# Patient Record
Sex: Male | Born: 1980 | Race: Black or African American | Hispanic: No | Marital: Single | Smoking: Current every day smoker
Health system: Southern US, Community
[De-identification: ages and names within clinical notes are randomized; demographics above are authoritative.]

## PROBLEM LIST (undated history)

## (undated) DIAGNOSIS — J189 Pneumonia, unspecified organism: Secondary | ICD-10-CM

---

## 2014-10-27 ENCOUNTER — Emergency Department (HOSPITAL_COMMUNITY)
Admission: EM | Admit: 2014-10-27 | Discharge: 2014-10-27 | Disposition: A | Discharge status: Home / Self Care | Payer: Self-pay | Attending: Emergency Medicine | Admitting: Emergency Medicine

## 2014-10-27 ENCOUNTER — Encounter (HOSPITAL_COMMUNITY): Payer: Self-pay | Admitting: Emergency Medicine

## 2014-10-27 ENCOUNTER — Emergency Department (HOSPITAL_COMMUNITY): Payer: Self-pay

## 2014-10-27 DIAGNOSIS — R112 Nausea with vomiting, unspecified: Secondary | ICD-10-CM | POA: Insufficient documentation

## 2014-10-27 DIAGNOSIS — Z720 Tobacco use: Secondary | ICD-10-CM | POA: Insufficient documentation

## 2014-10-27 DIAGNOSIS — R630 Anorexia: Secondary | ICD-10-CM | POA: Insufficient documentation

## 2014-10-27 DIAGNOSIS — R61 Generalized hyperhidrosis: Secondary | ICD-10-CM | POA: Insufficient documentation

## 2014-10-27 DIAGNOSIS — R591 Generalized enlarged lymph nodes: Secondary | ICD-10-CM

## 2014-10-27 LAB — URINALYSIS, ROUTINE W REFLEX MICROSCOPIC
Bilirubin Urine: NEGATIVE
Glucose, UA: NEGATIVE mg/dL
HGB URINE DIPSTICK: NEGATIVE
Ketones, ur: 15 mg/dL — AB
Leukocytes, UA: NEGATIVE
NITRITE: NEGATIVE
Protein, ur: NEGATIVE mg/dL
SPECIFIC GRAVITY, URINE: 1.014 (ref 1.005–1.030)
Urobilinogen, UA: 0.2 mg/dL (ref 0.0–1.0)
pH: 6 (ref 5.0–8.0)

## 2014-10-27 LAB — CBC WITH DIFFERENTIAL/PLATELET
Basophils Absolute: 0.1 10*3/uL (ref 0.0–0.1)
Basophils Relative: 1 % (ref 0–1)
Eosinophils Absolute: 0.2 10*3/uL (ref 0.0–0.7)
Eosinophils Relative: 4 % (ref 0–5)
HCT: 46 % (ref 39.0–52.0)
HEMOGLOBIN: 15.5 g/dL (ref 13.0–17.0)
LYMPHS PCT: 29 % (ref 12–46)
Lymphs Abs: 1.8 10*3/uL (ref 0.7–4.0)
MCH: 28.2 pg (ref 26.0–34.0)
MCHC: 33.7 g/dL (ref 30.0–36.0)
MCV: 83.6 fL (ref 78.0–100.0)
MONOS PCT: 8 % (ref 3–12)
Monocytes Absolute: 0.5 10*3/uL (ref 0.1–1.0)
NEUTROS PCT: 58 % (ref 43–77)
Neutro Abs: 3.6 10*3/uL (ref 1.7–7.7)
Platelets: 263 10*3/uL (ref 150–400)
RBC: 5.5 MIL/uL (ref 4.22–5.81)
RDW: 13.5 % (ref 11.5–15.5)
WBC: 6.2 10*3/uL (ref 4.0–10.5)

## 2014-10-27 LAB — COMPREHENSIVE METABOLIC PANEL
ALBUMIN: 4.1 g/dL (ref 3.5–5.2)
ALK PHOS: 73 U/L (ref 39–117)
ALT: 15 U/L (ref 0–53)
AST: 22 U/L (ref 0–37)
Anion gap: 5 (ref 5–15)
BUN: 8 mg/dL (ref 6–23)
CHLORIDE: 102 mmol/L (ref 96–112)
CO2: 33 mmol/L — AB (ref 19–32)
CREATININE: 1.03 mg/dL (ref 0.50–1.35)
Calcium: 9.2 mg/dL (ref 8.4–10.5)
GFR calc Af Amer: >90 mL/min (ref >90)
GFR calc non Af Amer: >90 mL/min (ref >90)
GLUCOSE: 92 mg/dL (ref 70–99)
POTASSIUM: 3.4 mmol/L — AB (ref 3.5–5.1)
SODIUM: 140 mmol/L (ref 135–145)
Total Bilirubin: 0.2 mg/dL — ABNORMAL LOW (ref 0.3–1.2)
Total Protein: 7 g/dL (ref 6.0–8.3)

## 2014-10-27 LAB — LIPASE, BLOOD: LIPASE: 27 U/L (ref 11–59)

## 2014-10-27 LAB — MONONUCLEOSIS SCREEN: Mono Screen: NEGATIVE

## 2014-10-27 MED ORDER — ONDANSETRON HCL 4 MG/2ML IJ SOLN
4.0000 mg | Freq: Once | INTRAMUSCULAR | Status: AC
  Administered 2014-10-27: 4 mg via INTRAVENOUS
  Filled 2014-10-27: qty 2

## 2014-10-27 MED ORDER — ONDANSETRON HCL 4 MG PO TABS: 4.0000 mg | ORAL_TABLET | Freq: Four times a day (QID) | ORAL | Status: DC

## 2014-10-27 NOTE — ED Provider Notes (Signed)
CSN: [id number]     Arrival date & time 10/27/14  1141 History   First MD Initiated Contact with Patient 10/27/14 1156     Chief Complaint  Patient presents with  . Emesis     (Consider location/radiation/quality/duration/timing/severity/associated sxs/prior Treatment) HPI  This is a 34 year old male who presents emergency Department for multiple systemic complaints. The patient states that about 3 days ago he awoke from sleep with a soaking night sweats. Patient states that he had to change his clothing. He has had another episode last night. His partner is with him and she states that he felt extremely warm. During the day. He is afebrile, but complains of severe fatigue, loss of taste sensation, lack of appetite. The patient has 3 episodes of vomiting over the past 3 days. One per day. He states that he is not eating very much, so it is very little that comes up. He does continue to drink a lot of fluid and does complain of frequent urination. He denies burning with urination, pain with urination, dysuria, urgency, or hematuria. He denies abdominal pain, constipation, diarrhea. The patient endorses severe stress at home and has had what he thinks is about a 10 pound weight loss since October of last year. He endorses lack of appetite. He denies depression.  History reviewed. No pertinent past medical history. History reviewed. No pertinent past surgical history. No family history on file. History  Substance Use Topics  . Smoking status: Current Every Day Smoker  . Smokeless tobacco: Not on file  . Alcohol Use: Yes    Review of Systems Ten systems reviewed and are negative for acute change, except as noted in the HPI.    Allergies  Review of patient's allergies indicates no known allergies.  Home Medications   Prior to Admission medications   Medication Sig Start Date End Date Taking? Authorizing Provider  ondansetron (ZOFRAN) 4 MG tablet Take 1 tablet (4 mg total) by mouth every  6 (six) hours. 10/27/14   [place] Searles, PA-C   BP 105/62 mmHg  Pulse 69  Temp(Src) 98.1 F (36.7 C) (Oral)  Resp 16  Ht 5\' 10"  (1.778 m)  Wt 151 lb (68.493 kg)  BMI 21.67 kg/m2  SpO2 100% Physical Exam  Constitutional: He appears well-developed and well-nourished. No distress.  HENT:  Head: Normocephalic and atraumatic.  Eyes: Conjunctivae are normal. No scleral icterus.  Neck: Normal range of motion. Neck supple.  Cardiovascular: Normal rate, regular rhythm and normal heart sounds.   Pulmonary/Chest: Effort normal and breath sounds normal. No respiratory distress.  Abdominal: Soft. There is no tenderness.  Musculoskeletal: He exhibits no edema.  Lymphadenopathy:    He has no cervical adenopathy.    He has no axillary adenopathy.       Right: No supraclavicular and no epitrochlear adenopathy present.       Left: No supraclavicular and no epitrochlear adenopathy present.     Neurological: He is alert.  Skin: Skin is warm and dry. He is not diaphoretic.  Multiple, singular shotty subcutaneous nodules involving the anterior neck, forearms, and chest  Psychiatric: His behavior is normal.  Nursing note and vitals reviewed.   ED Course  Procedures (including critical care time) Labs Review Labs Reviewed  COMPREHENSIVE METABOLIC PANEL - Abnormal; Notable for the following:    Potassium 3.4 (*)    CO2 33 (*)    Total Bilirubin 0.2 (*)    All other components within normal limits  URINALYSIS, ROUTINE W REFLEX MICROSCOPIC -  Abnormal; Notable for the following:    Ketones, ur 15 (*)    All other components within normal limits  CBC WITH DIFFERENTIAL/PLATELET  LIPASE, BLOOD  MONONUCLEOSIS SCREEN  HIV ANTIBODY (ROUTINE TESTING)  RPR    Imaging Review No results found.   EKG Interpretation None      MDM   Final diagnoses:  Non-intractable vomiting with nausea, vomiting of unspecified type  Night sweats  Loss of appetite    Patient with nausea and vomiting.  Suspect acute viral process. Labs and imaging are unremarkable. I personally reviewed the imaging tests through PACS system. I have reviewed and interpreted Lab values. I reviewed available ER/hospitalization records through the EMR  Patient sxs improved with treatment. Patient seen in shared visit with attending physician. Patient appears safe for discharge at this time. Discussed return precautions.     Margarita Mail, PA-C 11/01/14 1025  Charlesetta Shanks, MD 11/01/14 1544

## 2014-10-27 NOTE — Discharge Instructions (Signed)
Nausea and Vomiting °Nausea is a sick feeling that often comes before throwing up (vomiting). Vomiting is a reflex where stomach contents come out of your mouth. Vomiting can cause severe loss of body fluids (dehydration). Children and elderly adults can become dehydrated quickly, especially if they also have diarrhea. Nausea and vomiting are symptoms of a condition or disease. It is important to find the cause of your symptoms. °CAUSES  °· Direct irritation of the stomach lining. This irritation can result from increased acid production (gastroesophageal reflux disease), infection, food poisoning, taking certain medicines (such as nonsteroidal anti-inflammatory drugs), alcohol use, or tobacco use. °· Signals from the brain. These signals could be caused by a headache, heat exposure, an inner ear disturbance, increased pressure in the brain from injury, infection, a tumor, or a concussion, pain, emotional stimulus, or metabolic problems. °· An obstruction in the gastrointestinal tract (bowel obstruction). °· Illnesses such as diabetes, hepatitis, gallbladder problems, appendicitis, kidney problems, cancer, sepsis, atypical symptoms of a heart attack, or eating disorders. °· Medical treatments such as chemotherapy and radiation. °· Receiving medicine that makes you sleep (general anesthetic) during surgery. °DIAGNOSIS °Your caregiver may ask for tests to be done if the problems do not improve after a few days. Tests may also be done if symptoms are severe or if the reason for the nausea and vomiting is not clear. Tests may include: °· Urine tests. °· Blood tests. °· Stool tests. °· Cultures (to look for evidence of infection). °· X-rays or other imaging studies. °Test results can help your caregiver make decisions about treatment or the need for additional tests. °TREATMENT °You need to stay well hydrated. Drink frequently but in small amounts. You may wish to drink water, sports drinks, clear broth, or eat frozen  ice pops or gelatin dessert to help stay hydrated. When you eat, eating slowly may help prevent nausea. There are also some antinausea medicines that may help prevent nausea. °HOME CARE INSTRUCTIONS  °· Take all medicine as directed by your caregiver. °· If you do not have an appetite, do not force yourself to eat. However, you must continue to drink fluids. °· If you have an appetite, eat a normal diet unless your caregiver tells you differently. °¨ Eat a variety of complex carbohydrates (rice, wheat, potatoes, bread), lean meats, yogurt, fruits, and vegetables. °¨ Avoid high-fat foods because they are more difficult to digest. °· Drink enough water and fluids to keep your urine clear or pale yellow. °· If you are dehydrated, ask your caregiver for specific rehydration instructions. Signs of dehydration may include: °¨ Severe thirst. °¨ Dry lips and mouth. °¨ Dizziness. °¨ Dark urine. °¨ Decreasing urine frequency and amount. °¨ Confusion. °¨ Rapid breathing or pulse. °SEEK IMMEDIATE MEDICAL CARE IF:  °· You have blood or brown flecks (like coffee grounds) in your vomit. °· You have black or bloody stools. °· You have a severe headache or stiff neck. °· You are confused. °· You have severe abdominal pain. °· You have chest pain or trouble breathing. °· You do not urinate at least once every 8 hours. °· You develop cold or clammy skin. °· You continue to vomit for longer than 24 to 48 hours. °· You have a fever. °MAKE SURE YOU:  °· Understand these instructions. °· Will watch your condition. °· Will get help right away if you are not doing well or get worse. °Document Released: 08/17/2005 Document Revised: 11/09/2011 Document Reviewed: 01/14/2011 °[employer]® Patient Information ©2015 [employer]. This information is not intended   to replace advice given to you by your health care provider. Make sure you discuss any questions you have with your health care provider. ° °Viral Infections °A viral infection can be  caused by different types of viruses. Most viral infections are not serious and resolve on their own. However, some infections may cause severe symptoms and may lead to further complications. °SYMPTOMS °Viruses can frequently cause: °· Minor sore throat. °· Aches and pains. °· Headaches. °· Runny nose. °· Different types of rashes. °· Watery eyes. °· Tiredness. °· Cough. °· Loss of appetite. °· Gastrointestinal infections, resulting in nausea, vomiting, and diarrhea. °These symptoms do not respond to antibiotics because the infection is not caused by bacteria. However, you might catch a bacterial infection following the viral infection. This is sometimes called a "superinfection." Symptoms of such a bacterial infection may include: °· Worsening sore throat with pus and difficulty swallowing. °· Swollen neck glands. °· Chills and a high or persistent fever. °· Severe headache. °· Tenderness over the sinuses. °· Persistent overall ill feeling (malaise), muscle aches, and tiredness (fatigue). °· Persistent cough. °· Yellow, green, or brown mucus production with coughing. °HOME CARE INSTRUCTIONS  °· Only take over-the-counter or prescription medicines for pain, discomfort, diarrhea, or fever as directed by your caregiver. °· Drink enough water and fluids to keep your urine clear or pale yellow. Sports drinks can provide valuable electrolytes, sugars, and hydration. °· Get plenty of rest and maintain proper nutrition. Soups and broths with crackers or rice are fine. °SEEK IMMEDIATE MEDICAL CARE IF:  °· You have severe headaches, shortness of breath, chest pain, neck pain, or an unusual rash. °· You have uncontrolled vomiting, diarrhea, or you are unable to keep down fluids. °· You or your child has an oral temperature above 102° F (38.9° C), not controlled by medicine. °· Your baby is older than 3 months with a rectal temperature of 102° F (38.9° C) or higher. °· Your baby is 3 months old or younger with a rectal  temperature of 100.4° F (38° C) or higher. °MAKE SURE YOU:  °· Understand these instructions. °· Will watch your condition. °· Will get help right away if you are not doing well or get worse. °Document Released: 05/27/2005 Document Revised: 11/09/2011 Document Reviewed: 12/22/2010 °Exit[employer]® Patient Information ©2015 [employer]. This information is not intended to replace advice given to you by your health care provider. Make sure you discuss any questions you have with your health care provider. ° °

## 2014-10-27 NOTE — ED Notes (Signed)
Pt. Stated, i started vomiting yesterday with chills and sweating.

## 2014-10-27 NOTE — ED Notes (Signed)
Pt in xray

## 2014-10-27 NOTE — ED Notes (Signed)
EDP at bedside  

## 2014-10-28 LAB — RPR: RPR Ser Ql: NONREACTIVE

## 2014-10-28 LAB — HIV ANTIBODY (ROUTINE TESTING W REFLEX): HIV Screen 4th Generation wRfx: NONREACTIVE

## 2015-03-22 ENCOUNTER — Emergency Department (HOSPITAL_COMMUNITY): Payer: Self-pay

## 2015-03-22 ENCOUNTER — Inpatient Hospital Stay (HOSPITAL_COMMUNITY)
Admission: EM | Admit: 2015-03-22 | Discharge: 2015-03-23 | DRG: 580 | Disposition: A | Discharge status: Home / Self Care | Payer: Self-pay | Attending: Surgery | Admitting: Surgery

## 2015-03-22 ENCOUNTER — Encounter (HOSPITAL_COMMUNITY): Payer: Self-pay | Admitting: Radiology

## 2015-03-22 DIAGNOSIS — W25XXXA Contact with sharp glass, initial encounter: Secondary | ICD-10-CM | POA: Diagnosis present

## 2015-03-22 DIAGNOSIS — S21112A Laceration without foreign body of left front wall of thorax without penetration into thoracic cavity, initial encounter: Secondary | ICD-10-CM | POA: Diagnosis present

## 2015-03-22 DIAGNOSIS — S1191XA Laceration without foreign body of unspecified part of neck, initial encounter: Secondary | ICD-10-CM | POA: Diagnosis present

## 2015-03-22 DIAGNOSIS — F17210 Nicotine dependence, cigarettes, uncomplicated: Secondary | ICD-10-CM | POA: Diagnosis present

## 2015-03-22 DIAGNOSIS — S1181XA Laceration without foreign body of other specified part of neck, initial encounter: Principal | ICD-10-CM | POA: Diagnosis present

## 2015-03-22 DIAGNOSIS — F10920 Alcohol use, unspecified with intoxication, uncomplicated: Secondary | ICD-10-CM

## 2015-03-22 LAB — COMPREHENSIVE METABOLIC PANEL
ALK PHOS: 64 U/L (ref 38–126)
ALT: 13 U/L — ABNORMAL LOW (ref 17–63)
AST: 21 U/L (ref 15–41)
Albumin: 4.4 g/dL (ref 3.5–5.0)
Anion gap: 14 (ref 5–15)
BUN: 7 mg/dL (ref 6–20)
CO2: 21 mmol/L — ABNORMAL LOW (ref 22–32)
Calcium: 8.7 mg/dL — ABNORMAL LOW (ref 8.9–10.3)
Chloride: 102 mmol/L (ref 101–111)
Creatinine, Ser: 1.03 mg/dL (ref 0.61–1.24)
GFR calc non Af Amer: >60 mL/min (ref >60)
Glucose, Bld: 95 mg/dL (ref 65–99)
POTASSIUM: 3.2 mmol/L — AB (ref 3.5–5.1)
Sodium: 137 mmol/L (ref 135–145)
Total Bilirubin: 0.6 mg/dL (ref 0.3–1.2)
Total Protein: 7.2 g/dL (ref 6.5–8.1)

## 2015-03-22 LAB — CBC
HEMATOCRIT: 43.4 % (ref 39.0–52.0)
Hemoglobin: 14.7 g/dL (ref 13.0–17.0)
MCH: 28.5 pg (ref 26.0–34.0)
MCHC: 33.9 g/dL (ref 30.0–36.0)
MCV: 84.1 fL (ref 78.0–100.0)
PLATELETS: 242 10*3/uL (ref 150–400)
RBC: 5.16 MIL/uL (ref 4.22–5.81)
RDW: 13.7 % (ref 11.5–15.5)
WBC: 7.7 10*3/uL (ref 4.0–10.5)

## 2015-03-22 LAB — PREPARE FRESH FROZEN PLASMA
Unit division: 0
Unit division: 0

## 2015-03-22 LAB — PROTIME-INR
INR: 1.03 (ref 0.00–1.49)
Prothrombin Time: 13.7 seconds (ref 11.6–15.2)

## 2015-03-22 LAB — ABO/RH: ABO/RH(D): AB POS

## 2015-03-22 LAB — ETHANOL: Alcohol, Ethyl (B): 139 mg/dL — ABNORMAL HIGH (ref <5)

## 2015-03-22 LAB — CDS SEROLOGY

## 2015-03-22 MED ORDER — SODIUM CHLORIDE 0.9 % IV BOLUS (SEPSIS): 1000.0000 mL | Freq: Once | INTRAVENOUS | Status: AC

## 2015-03-22 MED ORDER — SODIUM CHLORIDE 0.9 % IV SOLN
INTRAVENOUS | Status: AC | PRN
  Administered 2015-03-22: 1000 mL via INTRAVENOUS

## 2015-03-22 MED ORDER — IOHEXOL 350 MG/ML SOLN
50.0000 mL | Freq: Once | INTRAVENOUS | Status: AC | PRN
  Administered 2015-03-22: 50 mL via INTRAVENOUS
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
## 2015-03-22 NOTE — ED Notes (Signed)
To CT

## 2015-03-23 ENCOUNTER — Encounter (HOSPITAL_COMMUNITY): Payer: Self-pay | Admitting: *Deleted

## 2015-03-23 ENCOUNTER — Encounter (HOSPITAL_COMMUNITY): Admission: EM | Disposition: A | Discharge status: Home / Self Care | Payer: Self-pay | Source: Home / Self Care

## 2015-03-23 ENCOUNTER — Inpatient Hospital Stay (HOSPITAL_COMMUNITY): Payer: Self-pay | Admitting: Anesthesiology

## 2015-03-23 HISTORY — PX: DIRECT LARYNGOSCOPY: SHX5326

## 2015-03-23 HISTORY — PX: I & D EXTREMITY: SHX5045

## 2015-03-23 HISTORY — PX: ESOPHAGOSCOPY: SHX5534

## 2015-03-23 LAB — COMPREHENSIVE METABOLIC PANEL
ALBUMIN: 3.6 g/dL (ref 3.5–5.0)
ALT: 11 U/L — ABNORMAL LOW (ref 17–63)
AST: 19 U/L (ref 15–41)
Alkaline Phosphatase: 60 U/L (ref 38–126)
Anion gap: 6 (ref 5–15)
BUN: 5 mg/dL — AB (ref 6–20)
CALCIUM: 8.2 mg/dL — AB (ref 8.9–10.3)
CO2: 24 mmol/L (ref 22–32)
Chloride: 105 mmol/L (ref 101–111)
Creatinine, Ser: 0.8 mg/dL (ref 0.61–1.24)
GFR calc Af Amer: >60 mL/min (ref >60)
GFR calc non Af Amer: >60 mL/min (ref >60)
Glucose, Bld: 87 mg/dL (ref 65–99)
Potassium: 3.9 mmol/L (ref 3.5–5.1)
Sodium: 135 mmol/L (ref 135–145)
Total Bilirubin: 0.8 mg/dL (ref 0.3–1.2)
Total Protein: 6.1 g/dL — ABNORMAL LOW (ref 6.5–8.1)

## 2015-03-23 LAB — CBC
HCT: 41.1 % (ref 39.0–52.0)
HEMOGLOBIN: 13.7 g/dL (ref 13.0–17.0)
MCH: 28.7 pg (ref 26.0–34.0)
MCHC: 33.3 g/dL (ref 30.0–36.0)
MCV: 86.2 fL (ref 78.0–100.0)
PLATELETS: 206 10*3/uL (ref 150–400)
RBC: 4.77 MIL/uL (ref 4.22–5.81)
RDW: 14 % (ref 11.5–15.5)
WBC: 12.5 10*3/uL — AB (ref 4.0–10.5)

## 2015-03-23 LAB — MRSA PCR SCREENING: MRSA by PCR: NEGATIVE

## 2015-03-23 SURGERY — IRRIGATION AND DEBRIDEMENT EXTREMITY
Anesthesia: General

## 2015-03-23 MED ORDER — SUCCINYLCHOLINE CHLORIDE 20 MG/ML IJ SOLN
INTRAMUSCULAR | Status: DC | PRN
  Administered 2015-03-23: 160 mg via INTRAVENOUS

## 2015-03-23 MED ORDER — SODIUM CHLORIDE 0.9 % IV SOLN
INTRAVENOUS | Status: DC | PRN
  Administered 2015-03-23: 01:00:00 via INTRAVENOUS

## 2015-03-23 MED ORDER — BOOST / RESOURCE BREEZE PO LIQD: 1.0000 | Freq: Three times a day (TID) | ORAL | Status: DC

## 2015-03-23 MED ORDER — PANTOPRAZOLE SODIUM 40 MG PO TBEC
40.0000 mg | DELAYED_RELEASE_TABLET | Freq: Every day | ORAL | Status: DC
  Administered 2015-03-23: 40 mg via ORAL
  Filled 2015-03-23: qty 1

## 2015-03-23 MED ORDER — SODIUM CHLORIDE 0.9 % IV SOLN
INTRAVENOUS | Status: DC
  Administered 2015-03-23: 03:00:00 via INTRAVENOUS

## 2015-03-23 MED ORDER — HYDROCODONE-ACETAMINOPHEN 5-325 MG PO TABS: 1.0000 | ORAL_TABLET | Freq: Four times a day (QID) | ORAL | Status: DC | PRN

## 2015-03-23 MED ORDER — ROCURONIUM BROMIDE 50 MG/5ML IV SOLN
INTRAVENOUS | Status: AC
  Filled 2015-03-23: qty 1

## 2015-03-23 MED ORDER — ONDANSETRON HCL 4 MG PO TABS: 4.0000 mg | ORAL_TABLET | Freq: Four times a day (QID) | ORAL | Status: DC | PRN

## 2015-03-23 MED ORDER — PROPOFOL 10 MG/ML IV BOLUS
INTRAVENOUS | Status: DC | PRN
  Administered 2015-03-23: 160 mg via INTRAVENOUS

## 2015-03-23 MED ORDER — MIDAZOLAM HCL 2 MG/2ML IJ SOLN
INTRAMUSCULAR | Status: DC | PRN
  Administered 2015-03-23: 2 mg via INTRAVENOUS

## 2015-03-23 MED ORDER — CEFAZOLIN SODIUM-DEXTROSE 2-3 GM-% IV SOLR
INTRAVENOUS | Status: DC | PRN
  Administered 2015-03-23: 2 g via INTRAVENOUS

## 2015-03-23 MED ORDER — ONDANSETRON HCL 4 MG/2ML IJ SOLN
INTRAMUSCULAR | Status: AC
  Filled 2015-03-23: qty 2

## 2015-03-23 MED ORDER — HYDROMORPHONE HCL 1 MG/ML IJ SOLN
INTRAMUSCULAR | Status: AC
  Filled 2015-03-23: qty 1

## 2015-03-23 MED ORDER — MIDAZOLAM HCL 2 MG/2ML IJ SOLN
INTRAMUSCULAR | Status: AC
  Filled 2015-03-23: qty 2

## 2015-03-23 MED ORDER — HYDROMORPHONE HCL 1 MG/ML IJ SOLN
0.2500 mg | INTRAMUSCULAR | Status: DC | PRN
  Administered 2015-03-23 (×2): 0.5 mg via INTRAVENOUS

## 2015-03-23 MED ORDER — FENTANYL CITRATE (PF) 250 MCG/5ML IJ SOLN
INTRAMUSCULAR | Status: AC
  Filled 2015-03-23: qty 5

## 2015-03-23 MED ORDER — ONDANSETRON HCL 4 MG/2ML IJ SOLN
INTRAMUSCULAR | Status: DC | PRN
  Administered 2015-03-23: 4 mg via INTRAVENOUS

## 2015-03-23 MED ORDER — LIDOCAINE HCL (CARDIAC) 20 MG/ML IV SOLN
INTRAVENOUS | Status: DC | PRN
  Administered 2015-03-23: 100 mg via INTRAVENOUS

## 2015-03-23 MED ORDER — PNEUMOCOCCAL VAC POLYVALENT 25 MCG/0.5ML IJ INJ: 0.5000 mL | INJECTION | INTRAMUSCULAR | Status: DC

## 2015-03-23 MED ORDER — HYDROMORPHONE HCL 1 MG/ML IJ SOLN: 1.0000 mg | INTRAMUSCULAR | Status: DC | PRN

## 2015-03-23 MED ORDER — PANTOPRAZOLE SODIUM 40 MG IV SOLR
40.0000 mg | Freq: Every day | INTRAVENOUS | Status: DC
  Filled 2015-03-23: qty 40

## 2015-03-23 MED ORDER — FENTANYL CITRATE (PF) 250 MCG/5ML IJ SOLN
INTRAMUSCULAR | Status: DC | PRN
  Administered 2015-03-23: 100 ug via INTRAVENOUS

## 2015-03-23 MED ORDER — ONDANSETRON HCL 4 MG/2ML IJ SOLN: 4.0000 mg | Freq: Four times a day (QID) | INTRAMUSCULAR | Status: DC | PRN

## 2015-03-23 MED ORDER — PROMETHAZINE HCL 25 MG/ML IJ SOLN
6.2500 mg | INTRAMUSCULAR | Status: DC | PRN
Start: 2015-03-23 — End: 2015-03-23

## 2015-03-23 MED ORDER — 0.9 % SODIUM CHLORIDE (POUR BTL) OPTIME
TOPICAL | Status: DC | PRN
  Administered 2015-03-23: 1000 mL

## 2015-03-23 SURGICAL SUPPLY — 62 items
BALLN PULM 15 16.5 18 X 75CM (BALLOONS)
BALLN PULM 15 16.5 18X75 (BALLOONS)
BALLOON PULM 15 16.5 18X75 (BALLOONS) IMPLANT
BLADE SURG 15 STRL LF DISP TIS (BLADE) IMPLANT
BLADE SURG 15 STRL SS (BLADE)
BLADE SURG ROTATE 9660 (MISCELLANEOUS) IMPLANT
BNDG GAUZE ELAST 4 BULKY (GAUZE/BANDAGES/DRESSINGS) IMPLANT
CANISTER SUCTION 2500CC (MISCELLANEOUS) ×4 IMPLANT
CLEANER TIP ELECTROSURG 2X2 (MISCELLANEOUS) ×4 IMPLANT
COVER MAYO STAND STRL (DRAPES) ×4 IMPLANT
COVER SURGICAL LIGHT HANDLE (MISCELLANEOUS) ×4 IMPLANT
COVER TABLE BACK 60X90 (DRAPES) ×4 IMPLANT
DRAPE LAPAROTOMY TRNSV 102X78 (DRAPE) ×4 IMPLANT
DRAPE PROXIMA HALF (DRAPES) ×4 IMPLANT
DRAPE UTILITY XL STRL (DRAPES) ×8 IMPLANT
DRSG PAD ABDOMINAL 8X10 ST (GAUZE/BANDAGES/DRESSINGS) IMPLANT
ELECT REM PT RETURN 9FT ADLT (ELECTROSURGICAL) ×4
ELECTRODE REM PT RTRN 9FT ADLT (ELECTROSURGICAL) ×2 IMPLANT
GAUZE SPONGE 4X4 12PLY STRL (GAUZE/BANDAGES/DRESSINGS) ×4 IMPLANT
GAUZE SPONGE 4X4 16PLY XRAY LF (GAUZE/BANDAGES/DRESSINGS) ×4 IMPLANT
GLOVE BIO SURGEON STRL SZ7 (GLOVE) ×8 IMPLANT
GLOVE BIOGEL PI IND STRL 7.0 (GLOVE) ×8 IMPLANT
GLOVE BIOGEL PI IND STRL 7.5 (GLOVE) ×4 IMPLANT
GLOVE BIOGEL PI INDICATOR 7.0 (GLOVE) ×8
GLOVE BIOGEL PI INDICATOR 7.5 (GLOVE) ×4
GLOVE ECLIPSE 7.5 STRL STRAW (GLOVE) ×4 IMPLANT
GOWN STRL REUS W/ TWL LRG LVL3 (GOWN DISPOSABLE) ×6 IMPLANT
GOWN STRL REUS W/TWL LRG LVL3 (GOWN DISPOSABLE) ×6
GUARD TEETH (MISCELLANEOUS) IMPLANT
HANDPIECE INTERPULSE COAX TIP (DISPOSABLE)
KIT BASIN OR (CUSTOM PROCEDURE TRAY) IMPLANT
KIT ROOM TURNOVER OR (KITS) ×8 IMPLANT
MARKER SKIN DUAL TIP RULER LAB (MISCELLANEOUS) IMPLANT
NEEDLE 27GAX1X1/2 (NEEDLE) IMPLANT
NS IRRIG 1000ML POUR BTL (IV SOLUTION) ×8 IMPLANT
PACK SURGICAL SETUP 50X90 (CUSTOM PROCEDURE TRAY) ×4 IMPLANT
PAD ARMBOARD 7.5X6 YLW CONV (MISCELLANEOUS) ×16 IMPLANT
PATTIES SURGICAL .5 X3 (DISPOSABLE) IMPLANT
PENCIL BUTTON HOLSTER BLD 10FT (ELECTRODE) ×4 IMPLANT
SET HNDPC FAN SPRY TIP SCT (DISPOSABLE) IMPLANT
SOLUTION ANTI FOG 6CC (MISCELLANEOUS) IMPLANT
SPECIMEN JAR SMALL (MISCELLANEOUS) IMPLANT
SPONGE LAP 18X18 X RAY DECT (DISPOSABLE) ×4 IMPLANT
SUT ETHILON 3 0 PS 1 (SUTURE) ×8 IMPLANT
SUT MNCRL AB 4-0 PS2 18 (SUTURE) ×4 IMPLANT
SUT SILK 2 0 (SUTURE)
SUT SILK 2-0 18XBRD TIE 12 (SUTURE) IMPLANT
SUT VIC AB 2-0 SH 27 (SUTURE)
SUT VIC AB 2-0 SH 27X BRD (SUTURE) IMPLANT
SUT VIC AB 3-0 SH 18 (SUTURE) ×4 IMPLANT
SUT VIC AB 3-0 SH 27 (SUTURE)
SUT VIC AB 3-0 SH 27XBRD (SUTURE) IMPLANT
SYR CONTROL 10ML LL (SYRINGE) IMPLANT
SYR TB 1ML LUER SLIP (SYRINGE) IMPLANT
TAPE CLOTH SURG 4X10 WHT LF (GAUZE/BANDAGES/DRESSINGS) ×4 IMPLANT
TOWEL OR 17X24 6PK STRL BLUE (TOWEL DISPOSABLE) ×8 IMPLANT
TOWEL OR 17X26 10 PK STRL BLUE (TOWEL DISPOSABLE) ×8 IMPLANT
TUBE ANAEROBIC SPECIMEN COL (MISCELLANEOUS) IMPLANT
TUBE CONNECTING 12'X1/4 (SUCTIONS) ×2
TUBE CONNECTING 12X1/4 (SUCTIONS) ×6 IMPLANT
WATER STERILE IRR 1000ML POUR (IV SOLUTION) ×8 IMPLANT
YANKAUER SUCT BULB TIP NO VENT (SUCTIONS) ×4 IMPLANT
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
## 2015-03-23 NOTE — ED Notes (Signed)
Report given to Clarene Critchley, [place] in holding
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
## 2015-03-23 NOTE — Anesthesia Procedure Notes (Signed)
Procedure Name: Intubation Date/Time: 03/23/2015 12:41 AM Performed by: Valetta Fuller Pre-anesthesia Checklist: Patient identified, Emergency Drugs available, Suction available and Patient being monitored Patient Re-evaluated:Patient Re-evaluated prior to inductionOxygen Delivery Method: Circle system utilized Preoxygenation: Pre-oxygenation with 100% oxygen Intubation Type: IV induction, Rapid sequence and Cricoid Pressure applied Laryngoscope Size: Miller and 2 Grade View: Grade I Tube type: Oral Tube size: 7.5 mm Number of attempts: 1 Placement Confirmation: ETT inserted through vocal cords under direct vision,  positive ETCO2 and breath sounds checked- equal and bilateral Secured at: 24 cm Tube secured with: marked with tape. Dental Injury: Teeth and Oropharynx as per pre-operative assessment

## 2015-03-23 NOTE — Anesthesia Postprocedure Evaluation (Signed)
Anesthesia Post Note  Patient: Lance Walton  Procedure(s) Performed: Procedure(s) (LRB): IRRIGATION AND DEBRIDEMENT WOUND EXPLORATION OF NECK (Left) DIRECT LARYNGOSCOPY (N/A) ESOPHAGOSCOPY (N/A)  Anesthesia type: general  Patient location: PACU  Post pain: Pain level controlled  Post assessment: Patient's Cardiovascular Status Stable  Last Vitals:  Filed Vitals:   03/23/15 1000  BP: 113/68  Pulse: 70  Temp:   Resp: 18    Post vital signs: Reviewed and stable  Level of consciousness: sedated  Complications: No apparent anesthesia complications
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
## 2015-03-23 NOTE — ED Notes (Signed)
CSI at the bedside 

## 2015-03-23 NOTE — Anesthesia Preprocedure Evaluation (Addendum)
Anesthesia Evaluation  Patient identified by MRN, date of birth, ID band Patient awake    Reviewed: Allergy & Precautions, NPO status , Patient's Chart, lab work & pertinent test results  Airway Mallampati: I  TM Distance: >3 FB Neck ROM: Full    Dental  (+) Partial Upper, Dental Advisory Given,    Pulmonary COPDCurrent Smoker,    Pulmonary exam normal       Cardiovascular Rhythm:Regular Rate:Normal     Neuro/Psych    GI/Hepatic   Endo/Other    Renal/GU      Musculoskeletal   Abdominal   Peds  Hematology   Anesthesia Other Findings   Reproductive/Obstetrics                            Anesthesia Physical Anesthesia Plan  ASA: III and emergent  Anesthesia Plan: General   Post-op Pain Management:    Induction: Intravenous, Rapid sequence and Cricoid pressure planned  Airway Management Planned: Oral ETT  Additional Equipment:   Intra-op Plan:   Post-operative Plan: Extubation in OR  Informed Consent: I have reviewed the patients History and Physical, chart, labs and discussed the procedure including the risks, benefits and alternatives for the proposed anesthesia with the patient or authorized representative who has indicated his/her understanding and acceptance.   Dental advisory given  Plan Discussed with: CRNA, Anesthesiologist and Surgeon  Anesthesia Plan Comments:        Anesthesia Quick Evaluation

## 2015-03-23 NOTE — Transfer of Care (Signed)
Immediate Anesthesia Transfer of Care Note  Patient: Lance Walton  Procedure(s) Performed: Procedure(s): IRRIGATION AND DEBRIDEMENT WOUND EXPLORATION OF NECK (Left) DIRECT LARYNGOSCOPY (N/A) ESOPHAGOSCOPY (N/A)  Patient Location: PACU  Anesthesia Type:General  Level of Consciousness: sedated and patient cooperative  Airway & Oxygen Therapy: Patient connected to nasal cannula oxygen  Post-op Assessment: Report given to RN and Post -op Vital signs reviewed and stable  Post vital signs: Reviewed and stable  Last Vitals:  Filed Vitals:   03/23/15 0000  BP: 108/71  Pulse: 88  Temp:   Resp: 19    Complications: No apparent anesthesia complications
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
## 2015-03-23 NOTE — Discharge Instructions (Signed)
You may shower and gently cleanse the area.  May cover with a band aid.  Call if you have fevers over 101.

## 2015-03-25 ENCOUNTER — Telehealth (HOSPITAL_COMMUNITY): Payer: Self-pay

## 2015-03-25 ENCOUNTER — Emergency Department (HOSPITAL_COMMUNITY)
Admission: EM | Admit: 2015-03-25 | Discharge: 2015-03-25 | Discharge status: Against Medical Advice | Payer: Self-pay | Attending: Emergency Medicine | Admitting: Emergency Medicine

## 2015-03-25 ENCOUNTER — Encounter (HOSPITAL_COMMUNITY): Payer: Self-pay | Admitting: Surgery

## 2015-03-25 DIAGNOSIS — Z720 Tobacco use: Secondary | ICD-10-CM | POA: Insufficient documentation

## 2015-03-25 DIAGNOSIS — M542 Cervicalgia: Secondary | ICD-10-CM | POA: Insufficient documentation

## 2015-03-25 LAB — TYPE AND SCREEN
ABO/RH(D): AB POS
ANTIBODY SCREEN: NEGATIVE
UNIT DIVISION: 0
Unit division: 0

## 2015-03-25 MED ORDER — MORPHINE SULFATE 4 MG/ML IJ SOLN: 4.0000 mg | Freq: Once | INTRAMUSCULAR | Status: DC
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
## 2015-03-25 NOTE — ED Notes (Signed)
Went to patient's room and he is not there.   Will continue to monitor if patient returns.

## 2015-03-25 NOTE — ED Notes (Signed)
Patient not in room.   Patient may have eloped.   Cannot locate patient.

## 2015-03-25 NOTE — Telephone Encounter (Signed)
Pt called. Stated pain medication not working and would like something stronger. Advised to follow up with surgeon or if unable to reach he could return to Ed.
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
## 2015-03-29 ENCOUNTER — Encounter (HOSPITAL_COMMUNITY): Payer: Self-pay | Admitting: Emergency Medicine

## 2015-03-29 ENCOUNTER — Emergency Department (HOSPITAL_COMMUNITY)
Admission: EM | Admit: 2015-03-29 | Discharge: 2015-03-29 | Disposition: A | Discharge status: Home / Self Care | Payer: Self-pay | Attending: Emergency Medicine | Admitting: Emergency Medicine

## 2015-03-29 DIAGNOSIS — Z720 Tobacco use: Secondary | ICD-10-CM | POA: Insufficient documentation

## 2015-03-29 DIAGNOSIS — Z4802 Encounter for removal of sutures: Secondary | ICD-10-CM | POA: Insufficient documentation
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
## 2015-03-29 NOTE — ED Notes (Signed)
Dressing and bacitracin per PA student.

## 2015-03-29 NOTE — Discharge Instructions (Signed)
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
## 2016-08-31 ENCOUNTER — Emergency Department (HOSPITAL_COMMUNITY)
Admission: EM | Admit: 2016-08-31 | Discharge: 2016-08-31 | Disposition: A | Discharge status: Home / Self Care | Payer: Self-pay | Attending: Dermatology | Admitting: Dermatology

## 2016-08-31 ENCOUNTER — Encounter (HOSPITAL_COMMUNITY): Payer: Self-pay | Admitting: Emergency Medicine

## 2016-08-31 DIAGNOSIS — R197 Diarrhea, unspecified: Secondary | ICD-10-CM | POA: Insufficient documentation

## 2016-08-31 DIAGNOSIS — Z5321 Procedure and treatment not carried out due to patient leaving prior to being seen by health care provider: Secondary | ICD-10-CM | POA: Insufficient documentation

## 2016-08-31 DIAGNOSIS — R509 Fever, unspecified: Secondary | ICD-10-CM | POA: Insufficient documentation

## 2016-08-31 HISTORY — DX: Pneumonia, unspecified organism: J18.9

## 2016-08-31 LAB — COMPREHENSIVE METABOLIC PANEL
ALT: 13 U/L — ABNORMAL LOW (ref 17–63)
AST: 19 U/L (ref 15–41)
Albumin: 3.9 g/dL (ref 3.5–5.0)
Alkaline Phosphatase: 54 U/L (ref 38–126)
Anion gap: 12 (ref 5–15)
BILIRUBIN TOTAL: 0.6 mg/dL (ref 0.3–1.2)
BUN: 20 mg/dL (ref 6–20)
CO2: 26 mmol/L (ref 22–32)
Calcium: 8.8 mg/dL — ABNORMAL LOW (ref 8.9–10.3)
Chloride: 97 mmol/L — ABNORMAL LOW (ref 101–111)
Creatinine, Ser: 0.99 mg/dL (ref 0.61–1.24)
GFR calc Af Amer: >60 mL/min (ref >60)
Glucose, Bld: 119 mg/dL — ABNORMAL HIGH (ref 65–99)
Potassium: 3.4 mmol/L — ABNORMAL LOW (ref 3.5–5.1)
Sodium: 135 mmol/L (ref 135–145)
Total Protein: 7.4 g/dL (ref 6.5–8.1)

## 2016-08-31 LAB — CBC
HEMATOCRIT: 41.4 % (ref 39.0–52.0)
Hemoglobin: 13.8 g/dL (ref 13.0–17.0)
MCH: 27.4 pg (ref 26.0–34.0)
MCHC: 33.3 g/dL (ref 30.0–36.0)
MCV: 82.1 fL (ref 78.0–100.0)
Platelets: 196 10*3/uL (ref 150–400)
RBC: 5.04 MIL/uL (ref 4.22–5.81)
RDW: 13 % (ref 11.5–15.5)
WBC: 10.3 10*3/uL (ref 4.0–10.5)

## 2016-08-31 LAB — URINALYSIS, ROUTINE W REFLEX MICROSCOPIC
Bilirubin Urine: NEGATIVE
Glucose, UA: NEGATIVE mg/dL
Ketones, ur: 20 mg/dL — AB
LEUKOCYTES UA: NEGATIVE
Nitrite: NEGATIVE
Protein, ur: 100 mg/dL — AB
SPECIFIC GRAVITY, URINE: 1.029 (ref 1.005–1.030)
WBC, UA: NONE SEEN WBC/hpf (ref 0–5)
pH: 5 (ref 5.0–8.0)

## 2016-08-31 LAB — LIPASE, BLOOD: Lipase: 43 U/L (ref 11–51)

## 2016-08-31 NOTE — ED Notes (Signed)
Name called to lobby x1 with no response.
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
## 2016-08-31 NOTE — ED Notes (Signed)
Name called to lobby with no response.

## 2016-08-31 NOTE — ED Notes (Addendum)
Patient left facility after being triaged but before being seen by a provider. Patient was conscious, alert, oriented, ambulatory, breathing normally, and appeared to be in no acute distress.

## 2016-08-31 NOTE — ED Notes (Signed)
Called back for vital signs, patient not seen in lobby

## 2021-08-04 ENCOUNTER — Encounter (HOSPITAL_COMMUNITY): Payer: Self-pay

## 2021-08-04 ENCOUNTER — Ambulatory Visit (INDEPENDENT_AMBULATORY_CARE_PROVIDER_SITE_OTHER): Payer: Self-pay

## 2021-08-04 ENCOUNTER — Ambulatory Visit (HOSPITAL_COMMUNITY)
Admission: EM | Admit: 2021-08-04 | Discharge: 2021-08-04 | Disposition: A | Discharge status: Home / Self Care | Payer: Self-pay | Attending: Family Medicine | Admitting: Family Medicine

## 2021-08-04 ENCOUNTER — Other Ambulatory Visit: Payer: Self-pay

## 2021-08-04 DIAGNOSIS — S62313A Displaced fracture of base of third metacarpal bone, left hand, initial encounter for closed fracture: Secondary | ICD-10-CM

## 2021-08-04 MED ORDER — HYDROCODONE-ACETAMINOPHEN 5-325 MG PO TABS: 1.0000 | ORAL_TABLET | Freq: Four times a day (QID) | ORAL | 0 refills | Status: DC | PRN

## 2021-08-04 MED ORDER — IBUPROFEN 800 MG PO TABS: 800.0000 mg | ORAL_TABLET | Freq: Three times a day (TID) | ORAL | 0 refills | Status: DC
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
## 2021-08-04 NOTE — Discharge Instructions (Addendum)
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
## 2021-10-27 ENCOUNTER — Other Ambulatory Visit: Payer: Self-pay

## 2021-10-27 ENCOUNTER — Encounter (HOSPITAL_COMMUNITY): Payer: Self-pay

## 2021-10-27 ENCOUNTER — Ambulatory Visit (HOSPITAL_COMMUNITY)
Admission: EM | Admit: 2021-10-27 | Discharge: 2021-10-27 | Disposition: A | Discharge status: Home / Self Care | Payer: Self-pay | Attending: Family Medicine | Admitting: Family Medicine

## 2021-10-27 DIAGNOSIS — D179 Benign lipomatous neoplasm, unspecified: Secondary | ICD-10-CM

## 2021-10-27 NOTE — Discharge Instructions (Addendum)
You were seen today for clearance to donate plasma.  There is no concern about these cysts and they are not a limitation to plasma donation at this time.

## 2021-10-27 NOTE — ED Triage Notes (Signed)
Pt reports having multiple abscesses to throat, he states he has had them since he was a child.
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
## 2021-11-13 ENCOUNTER — Other Ambulatory Visit (HOSPITAL_COMMUNITY)
Admission: EM | Admit: 2021-11-13 | Discharge: 2021-11-15 | Disposition: A | Discharge status: Home / Self Care | Payer: No Payment, Other | Attending: Family | Admitting: Family

## 2021-11-13 DIAGNOSIS — F1420 Cocaine dependence, uncomplicated: Secondary | ICD-10-CM | POA: Diagnosis present

## 2021-11-13 DIAGNOSIS — Z789 Other specified health status: Secondary | ICD-10-CM

## 2021-11-13 DIAGNOSIS — F1020 Alcohol dependence, uncomplicated: Secondary | ICD-10-CM | POA: Diagnosis present

## 2021-11-13 DIAGNOSIS — F1410 Cocaine abuse, uncomplicated: Secondary | ICD-10-CM | POA: Diagnosis not present

## 2021-11-13 DIAGNOSIS — Z20822 Contact with and (suspected) exposure to covid-19: Secondary | ICD-10-CM | POA: Diagnosis not present

## 2021-11-13 DIAGNOSIS — F1090 Alcohol use, unspecified, uncomplicated: Secondary | ICD-10-CM | POA: Diagnosis not present

## 2021-11-13 HISTORY — DX: Cocaine dependence, uncomplicated: F14.20

## 2021-11-13 LAB — LIPID PANEL
Cholesterol: 133 mg/dL (ref 0–200)
HDL: 62 mg/dL (ref >40)
LDL Cholesterol: 48 mg/dL (ref 0–99)
Total CHOL/HDL Ratio: 2.1 RATIO
Triglycerides: 114 mg/dL (ref <150)
VLDL: 23 mg/dL (ref 0–40)

## 2021-11-13 LAB — POCT URINE DRUG SCREEN - MANUAL ENTRY (I-SCREEN)
POC Amphetamine UR: NOT DETECTED
POC Buprenorphine (BUP): NOT DETECTED
POC Cocaine UR: POSITIVE — AB
POC Marijuana UR: POSITIVE — AB
POC Methadone UR: NOT DETECTED
POC Methamphetamine UR: NOT DETECTED
POC Morphine: NOT DETECTED
POC Oxazepam (BZO): NOT DETECTED
POC Oxycodone UR: NOT DETECTED
POC Secobarbital (BAR): NOT DETECTED

## 2021-11-13 LAB — COMPREHENSIVE METABOLIC PANEL
ALT: 16 U/L (ref 0–44)
AST: 22 U/L (ref 15–41)
Albumin: 3.9 g/dL (ref 3.5–5.0)
Alkaline Phosphatase: 52 U/L (ref 38–126)
Anion gap: 12 (ref 5–15)
BUN: 7 mg/dL (ref 6–20)
CO2: 25 mmol/L (ref 22–32)
Calcium: 9 mg/dL (ref 8.9–10.3)
Chloride: 99 mmol/L (ref 98–111)
Creatinine, Ser: 0.77 mg/dL (ref 0.61–1.24)
GFR, Estimated: >60 mL/min (ref >60)
Glucose, Bld: 138 mg/dL — ABNORMAL HIGH (ref 70–99)
Potassium: 3.9 mmol/L (ref 3.5–5.1)
Sodium: 136 mmol/L (ref 135–145)
Total Bilirubin: 0.5 mg/dL (ref 0.3–1.2)
Total Protein: 5.9 g/dL — ABNORMAL LOW (ref 6.5–8.1)

## 2021-11-13 LAB — CBC WITH DIFFERENTIAL/PLATELET
Abs Immature Granulocytes: 0.04 10*3/uL (ref 0.00–0.07)
Basophils Absolute: 0 10*3/uL (ref 0.0–0.1)
Basophils Relative: 0 %
Eosinophils Absolute: 0 10*3/uL (ref 0.0–0.5)
Eosinophils Relative: 0 %
HCT: 47.9 % (ref 39.0–52.0)
Hemoglobin: 16.3 g/dL (ref 13.0–17.0)
Immature Granulocytes: 0 %
Lymphocytes Relative: 13 %
Lymphs Abs: 1.7 10*3/uL (ref 0.7–4.0)
MCH: 28.8 pg (ref 26.0–34.0)
MCHC: 34 g/dL (ref 30.0–36.0)
MCV: 84.6 fL (ref 80.0–100.0)
Monocytes Absolute: 0.5 10*3/uL (ref 0.1–1.0)
Monocytes Relative: 4 %
Neutro Abs: 11.2 10*3/uL — ABNORMAL HIGH (ref 1.7–7.7)
Neutrophils Relative %: 83 %
Platelets: 267 10*3/uL (ref 150–400)
RBC: 5.66 MIL/uL (ref 4.22–5.81)
RDW: 14.2 % (ref 11.5–15.5)
WBC: 13.5 10*3/uL — ABNORMAL HIGH (ref 4.0–10.5)
nRBC: 0 % (ref 0.0–0.2)

## 2021-11-13 LAB — MAGNESIUM: Magnesium: 2.1 mg/dL (ref 1.7–2.4)

## 2021-11-13 LAB — RESP PANEL BY RT-PCR (FLU A&B, COVID) ARPGX2
Influenza A by PCR: NEGATIVE
Influenza B by PCR: NEGATIVE
SARS Coronavirus 2 by RT PCR: NEGATIVE

## 2021-11-13 LAB — ETHANOL: Alcohol, Ethyl (B): 13 mg/dL — ABNORMAL HIGH (ref <10)

## 2021-11-13 LAB — POC SARS CORONAVIRUS 2 AG: SARSCOV2ONAVIRUS 2 AG: NEGATIVE

## 2021-11-13 LAB — TSH: TSH: 0.239 u[IU]/mL — ABNORMAL LOW (ref 0.350–4.500)

## 2021-11-13 LAB — HEMOGLOBIN A1C
Hgb A1c MFr Bld: 5.3 % (ref 4.8–5.6)
Mean Plasma Glucose: 105.41 mg/dL

## 2021-11-13 LAB — POC SARS CORONAVIRUS 2 AG -  ED: SARS Coronavirus 2 Ag: NEGATIVE

## 2021-11-13 MED ORDER — LORAZEPAM 1 MG PO TABS
1.0000 mg | ORAL_TABLET | Freq: Four times a day (QID) | ORAL | Status: DC
  Administered 2021-11-13 – 2021-11-14 (×4): 1 mg via ORAL
  Filled 2021-11-13 (×6): qty 1

## 2021-11-13 MED ORDER — LORAZEPAM 1 MG PO TABS: 1.0000 mg | ORAL_TABLET | Freq: Two times a day (BID) | ORAL | Status: DC

## 2021-11-13 MED ORDER — HYDROXYZINE HCL 25 MG PO TABS: 25.0000 mg | ORAL_TABLET | Freq: Three times a day (TID) | ORAL | Status: DC | PRN

## 2021-11-13 MED ORDER — ONDANSETRON 4 MG PO TBDP: 4.0000 mg | ORAL_TABLET | Freq: Four times a day (QID) | ORAL | Status: DC | PRN

## 2021-11-13 MED ORDER — ALUM & MAG HYDROXIDE-SIMETH 200-200-20 MG/5ML PO SUSP: 30.0000 mL | ORAL | Status: DC | PRN

## 2021-11-13 MED ORDER — MAGNESIUM HYDROXIDE 400 MG/5ML PO SUSP: 30.0000 mL | Freq: Every day | ORAL | Status: DC | PRN

## 2021-11-13 MED ORDER — ACETAMINOPHEN 325 MG PO TABS: 650.0000 mg | ORAL_TABLET | Freq: Four times a day (QID) | ORAL | Status: DC | PRN

## 2021-11-13 MED ORDER — LORAZEPAM 1 MG PO TABS
1.0000 mg | ORAL_TABLET | Freq: Four times a day (QID) | ORAL | Status: DC | PRN
Start: 2021-11-13 — End: 2021-11-15

## 2021-11-13 MED ORDER — ADULT MULTIVITAMIN W/MINERALS CH
1.0000 | ORAL_TABLET | Freq: Every day | ORAL | Status: DC
  Administered 2021-11-13: 1 via ORAL
  Filled 2021-11-13 (×4): qty 1

## 2021-11-13 MED ORDER — LOPERAMIDE HCL 2 MG PO CAPS: 2.0000 mg | ORAL_CAPSULE | ORAL | Status: DC | PRN

## 2021-11-13 MED ORDER — TRAZODONE HCL 50 MG PO TABS
50.0000 mg | ORAL_TABLET | Freq: Every evening | ORAL | Status: DC | PRN
  Filled 2021-11-13: qty 1

## 2021-11-13 MED ORDER — LORAZEPAM 1 MG PO TABS
1.0000 mg | ORAL_TABLET | Freq: Every day | ORAL | Status: DC
Start: 2021-11-18 — End: 2021-11-15

## 2021-11-13 MED ORDER — THIAMINE HCL 100 MG/ML IJ SOLN
100.0000 mg | Freq: Once | INTRAMUSCULAR | Status: AC
  Administered 2021-11-13: 100 mg via INTRAMUSCULAR
  Filled 2021-11-13: qty 2

## 2021-11-13 MED ORDER — LORAZEPAM 1 MG PO TABS: 1.0000 mg | ORAL_TABLET | Freq: Three times a day (TID) | ORAL | Status: DC

## 2021-11-13 MED ORDER — THIAMINE HCL 100 MG PO TABS
100.0000 mg | ORAL_TABLET | Freq: Every day | ORAL | Status: DC
  Filled 2021-11-13 (×3): qty 1
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
## 2021-11-13 NOTE — ED Notes (Signed)
Pt pending 2 Hr COVID and transfer to [facility].  Comfort measures given. ?

## 2021-11-13 NOTE — BH Assessment (Signed)
Patient states, "I am not going to lie to you, I need help with my alcohol and cocaine problem."  Patient states that this is the first time that he has ever sought help for himself. Patient states that he he is sick of living this way.  He states that he has a 39 year old daughter he has custody of that he needs to be a better father to, he has a child getting ready to graduate high school and he states that it is time for him to step up and be the father he needs to be. Patient drinks five 24 oz beers daily, he states that he last used today.  He states that he has been using cocaine and marijuana daily.  Patient denies SI/HI/Psychosis.  Patient is urgent due to his need for detox. ?

## 2021-11-13 NOTE — ED Notes (Signed)
Pt asleep in bed. Respirations even and unlabored. Will continue to monitor for safety. ?
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
## 2021-11-14 ENCOUNTER — Encounter (HOSPITAL_COMMUNITY): Payer: Self-pay | Admitting: Family

## 2021-11-14 DIAGNOSIS — Z20822 Contact with and (suspected) exposure to covid-19: Secondary | ICD-10-CM | POA: Diagnosis not present

## 2021-11-14 DIAGNOSIS — F1090 Alcohol use, unspecified, uncomplicated: Secondary | ICD-10-CM | POA: Diagnosis not present

## 2021-11-14 DIAGNOSIS — F1410 Cocaine abuse, uncomplicated: Secondary | ICD-10-CM | POA: Diagnosis not present

## 2021-11-14 MED ORDER — NICOTINE 21 MG/24HR TD PT24
21.0000 mg | MEDICATED_PATCH | Freq: Every day | TRANSDERMAL | Status: DC
  Administered 2021-11-14: 21 mg via TRANSDERMAL
  Filled 2021-11-14 (×2): qty 1

## 2021-11-14 NOTE — Progress Notes (Signed)
Pt refused all morning meds. Pt stated that he does not want to take meds on an empty stomach. Options for breakfast was offered but he refused. Staff will monitor for pt's safety. ?

## 2021-11-14 NOTE — Progress Notes (Signed)
Pt is awake, alert and oriented. Pt did not voice any complaints of pain or discomfort. No signs of acute distress noted. Administered scheduled med with no issue. Pt denies current SI/HI/AVH. Staff will monitor for pt's safety. ?

## 2021-11-14 NOTE — ED Notes (Signed)
Pt resting quietly.  Care assumed.  ?

## 2021-11-14 NOTE — Clinical Social Work Psych Note (Signed)
LCSW Update Note ? ? ?Lance Walton was scheduled for a Screening for Admission appointment at [facility] on Tuesday, 11/18/21 at 9:00am for residential substance abuse treatment services.  ? ?Lance Walton will require a 14-day supply of medications, in addition to his 30 day prescription.  ? ? ?Lance Walton will be transported to [facility] via taxi voucher.  ? ? ? ?LCSW will continue to follow until discharge.  ? ? ?Radonna Ricker, MSW, LCSW ?Clinical Education officer, museum Insurance claims handler) ?[facility]  ? ? ? ?

## 2021-11-14 NOTE — ED Notes (Signed)
Lunch given.
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
## 2021-11-14 NOTE — Progress Notes (Signed)
Pt is asleep. Respirations are even and unlabored. No distress noted. Staff will monitor for pt's safety. 

## 2021-11-14 NOTE — Clinical Social Work Psych Note (Signed)
LCSW Initial Note ? ? ?LCSW met with Lance Walton for introduction and to begin discussions regarding treatment and potential discharge planning.  ? ? ?Lance Walton presented with a flat affect, euthymic mood. He denied having any SI, HI or AVH at this time. Lance Walton shared that he presented to the [facility] seeking assistance for detox services.  ? ?Lance Walton endorsed drinking alcohol, using cocaine and smoking cannabis on a daily basis. Lance Walton reports drinking on average, 3 (24 oz) beers daily, $80 worth of cocaine daily, and a varied amount of cannabis on a daily basis. Lance Walton shared that he has used each substance for 20+ years and this is his first attempt seeking professional assistance/services.   ? ?Lance Walton did not identify any triggering event or circumstances that contributes to his ongoing substance use, however he expressed "I am tired of living this way" as a motivating factor.  ? ?Lance Walton shared that he currently lives with his girlfriend and his 6yo daughter from another relationship. Lance Walton shared that he has had custody of his 6yo daughter since she was 1yo, due to her mother "abandoning her". He reports he also has an 39yo who is preparing to graduate from [employer], and that she lives with her mother. Lance Walton identifies his children as his main source of motivation.  ? ?Lance Walton expressed interest in residential treatment services for his ongoing substance abuse issues. LCSW explained the referral process  and Lance Walton was agreeable.  ? ?LCSW referred Lance Walton to [facility] for possible admission. ? ?Lance Walton denied having any additional questions or concerns at this time.  ? ?LCSW will continue to follow.  ? ?Radonna Ricker, MSW, LCSW ?Clinical Education officer, museum Insurance claims handler) ?[facility]  ?  ? ?

## 2021-11-14 NOTE — ED Notes (Signed)
Pt asleep in bed. Respirations even and unlabored. Will continue to monitor for safety. ?

## 2021-11-14 NOTE — ED Notes (Signed)
Pt has been resting quietly through the evening.  He does awaken to voice.  Denies si, hi and AVH.  Q 15 min checks in place.  Staff will continue to monitor for safety.  ?

## 2021-11-14 NOTE — ED Notes (Signed)
Pt refuse breakfast and all option that were given.  ?

## 2021-11-14 NOTE — ED Provider Notes (Signed)
Behavioral Health Progress Note  Date and Time: 11/14/2021 2:20 PM Name: Lance Walton MRN:  [id number]  Subjective:   41 yo male with history of substance abuse who presented to the [facility] on 11/13/2021 with request for assistance with detox from alcohol and daily drinking-patient was admitted tot [facility] for detox and crisis stablization. UDS+cocaine and marijuana. Etoh 13. Ativan detox protocol initiated on admission.   Patient seen and chart reviewed-he declined morning doses of medications including ativan. Most recent CIWA 0. Patient describes his mood as "alright". He denies SI/HI/AVH. He denies alcohol withdrawal sx of nausea, vomiting, headache, GI upset, diarrhea, tremors, diaphoresis. He denies previous medication trials, previous psychiatric treatment or hospitalizations, outpatient psychiatrist. He reports drug use as per HPI and also requests nicotine patch. Patient reports that he refused medications because he did not wish to take medications on an empty stomach and states that he does not like oatmeal so he declined breakfast. Patient denies all other physical complaints.  Discussed with patient that he was started on ativan taper for alcohol detox which is scheduled to end Tuesday and that [facility] will [facility]-patient is informed that he will be notified with updates. Patient was given the opportunity to ask questions and  All questions answered. Patient verbalized understanding regarding plan of care.       Diagnosis:  Final diagnoses:  Alcohol use  Cocaine use disorder (HCC)    Total Time spent with patient: 20 minutes  Past Psychiatric History: substance use Past Medical History:  Past Medical History:  Diagnosis Date   Pneumonia     Past Surgical History:  Procedure Laterality Date   DIRECT LARYNGOSCOPY N/A 03/23/2015   Procedure: DIRECT LARYNGOSCOPY;  Surgeon: Serena Colonel, MD;  Location: [facility] OR;  Service: ENT;  Laterality: N/A;    ESOPHAGOSCOPY N/A 03/23/2015   Procedure: ESOPHAGOSCOPY;  Surgeon: Serena Colonel, MD;  Location: [facility] OR;  Service: ENT;  Laterality: N/A;   I & D EXTREMITY Left 03/23/2015   Procedure: IRRIGATION AND DEBRIDEMENT WOUND EXPLORATION OF NECK;  Surgeon: Manus Rudd, MD;  Location: [facility] OR;  Service: General;  Laterality: Left;   Family History:  Family History  Family history unknown: Yes   Family Psychiatric  History: mother and father with addiction Social History:  Social History   Substance and Sexual Activity  Alcohol Use Yes     Social History   Substance and Sexual Activity  Drug Use No    Social History   Socioeconomic History   Marital status: Single    Spouse name: Not on file   Number of children: Not on file   Years of education: Not on file   Highest education level: Not on file  Occupational History   Not on file  Tobacco Use   Smoking status: Every Day   Smokeless tobacco: Never  Substance and Sexual Activity   Alcohol use: Yes   Drug use: No   Sexual activity: Not on file  Other Topics Concern   Not on file  Social History Narrative   Not on file   Social Determinants of Health   Financial Resource Strain: Not on file  Food Insecurity: Not on file  Transportation Needs: Not on file  Physical Activity: Not on file  Stress: Not on file  Social Connections: Not on file   SDOH:  SDOH Screenings   Alcohol Screen: Not on file  Depression (PHQ2-9): Medium Risk   PHQ-2 Score: 9  Financial Resource Strain: Not on file  Food Insecurity: Not on file  Housing: Not on file  Physical Activity: Not on file  Social Connections: Not on file  Stress: Not on file  Tobacco Use: High Risk   Smoking Tobacco Use: Every Day   Smokeless Tobacco Use: Never   Passive Exposure: Not on file  Transportation Needs: Not on file   Additional Social History:    Pain Medications: See MAR Prescriptions: See MAR Over the Counter: See MAR History of alcohol / drug use?:  Yes Longest period of sobriety (when/how long): Unknown Negative Consequences of Use:  (None noted) Withdrawal Symptoms: Nausea / Vomiting, Fever / Chills, Patient aware of relationship between substance abuse and physical/medical complications Name of Substance 1: EtOH 1 - Age of First Use: Unknown 1 - Amount (size/oz): 2-3 40-ounce beers 1 - Frequency: Daily 1 - Duration: Ongoing 1 - Last Use / Amount: Today (11/13/21) 1 - Method of Aquiring: Purchase 1- Route of Use: Oral Name of Substance 2: Cocaine 2 - Age of First Use: Unknown 2 - Amount (size/oz): $40 2 - Frequency: 3-4x/week 2 - Duration: Ongoing 2 - Last Use / Amount: Yesterday (11/12/21) 2 - Method of Aquiring: Unknown 2 - Route of Substance Use: Snort                Sleep: Fair  Appetite:  Good  Current Medications:  Current Facility-Administered Medications  Medication Dose Route Frequency Provider Last Rate Last Admin   acetaminophen (TYLENOL) tablet 650 mg  650 mg Oral Q6H PRN Lenard Lance, FNP       alum & mag hydroxide-simeth (MAALOX/MYLANTA) 200-200-20 MG/5ML suspension 30 mL  30 mL Oral Q4H PRN Lenard Lance, FNP       hydrOXYzine (ATARAX) tablet 25 mg  25 mg Oral TID PRN Lenard Lance, FNP       loperamide (IMODIUM) capsule 2-4 mg  2-4 mg Oral PRN Lenard Lance, FNP       LORazepam (ATIVAN) tablet 1 mg  1 mg Oral Q6H PRN Lenard Lance, FNP       LORazepam (ATIVAN) tablet 1 mg  1 mg Oral QID Lenard Lance, FNP   1 mg at 11/14/21 1404   Followed by   Melene Muller ON 11/15/2021] LORazepam (ATIVAN) tablet 1 mg  1 mg Oral TID Lenard Lance, FNP       Followed by   Melene Muller ON 11/16/2021] LORazepam (ATIVAN) tablet 1 mg  1 mg Oral BID Lenard Lance, FNP       Followed by   Melene Muller ON 11/18/2021] LORazepam (ATIVAN) tablet 1 mg  1 mg Oral Daily Lenard Lance, FNP       magnesium hydroxide (MILK OF MAGNESIA) suspension 30 mL  30 mL Oral Daily PRN Lenard Lance, FNP       multivitamin with minerals tablet 1 tablet  1 tablet  Oral Daily Lenard Lance, FNP   1 tablet at 11/13/21 2057   ondansetron (ZOFRAN-ODT) disintegrating tablet 4 mg  4 mg Oral Q6H PRN Lenard Lance, FNP       thiamine tablet 100 mg  100 mg Oral Daily Lenard Lance, FNP       traZODone (DESYREL) tablet 50 mg  50 mg Oral QHS PRN Lenard Lance, FNP       No current outpatient medications on file.    Labs  Lab Results:  Admission on 11/13/2021  Component  Date Value Ref Range Status   SARS Coronavirus 2 by RT PCR 11/13/2021 NEGATIVE  NEGATIVE Final   Comment: (NOTE) SARS-CoV-2 target nucleic acids are NOT DETECTED.  The SARS-CoV-2 RNA is generally detectable in upper respiratory specimens during the acute phase of infection. The lowest concentration of SARS-CoV-2 viral copies this assay can detect is 138 copies/mL. A negative result does not preclude SARS-Cov-2 infection and should not be used as the sole basis for treatment or other patient management decisions. A negative result may occur with  improper specimen collection/handling, submission of specimen other than nasopharyngeal swab, presence of viral mutation(s) within the areas targeted by this assay, and inadequate number of viral copies(<138 copies/mL). A negative result must be combined with clinical observations, patient history, and epidemiological information. The expected result is Negative.  Fact Sheet for Patients:  BloggerCourse.com  Fact Sheet for Healthcare Providers:  SeriousBroker.it  This test is no                          t yet approved or cleared by the [place] FDA and  has been authorized for detection and/or diagnosis of SARS-CoV-2 by FDA under an Emergency Use Authorization (EUA). This EUA will remain  in effect (meaning this test can be used) for the duration of the COVID-19 declaration under Section 564(b)(1) of the Act, 21 U.S.C.section 360bbb-3(b)(1), unless the authorization is terminated  or  revoked sooner.       Influenza A by PCR 11/13/2021 NEGATIVE  NEGATIVE Final   Influenza B by PCR 11/13/2021 NEGATIVE  NEGATIVE Final   Comment: (NOTE) The Xpert Xpress SARS-CoV-2/FLU/RSV plus assay is intended as an aid in the diagnosis of influenza from Nasopharyngeal swab specimens and should not be used as a sole basis for treatment. Nasal washings and aspirates are unacceptable for Xpert Xpress SARS-CoV-2/FLU/RSV testing.  Fact Sheet for Patients: BloggerCourse.com  Fact Sheet for Healthcare Providers: SeriousBroker.it  This test is not yet approved or cleared by the [place] FDA and has been authorized for detection and/or diagnosis of SARS-CoV-2 by FDA under an Emergency Use Authorization (EUA). This EUA will remain in effect (meaning this test can be used) for the duration of the COVID-19 declaration under Section 564(b)(1) of the Act, 21 U.S.C. section 360bbb-3(b)(1), unless the authorization is terminated or revoked.  Performed at [facility], [address] [place], [place] [address]    WBC 11/13/2021 13.5 (H)  4.0 - 10.5 K/uL Final   RBC 11/13/2021 5.66  4.22 - 5.81 MIL/uL Final   Hemoglobin 11/13/2021 16.3  13.0 - 17.0 g/dL Final   HCT 19/14/7829 47.9  39.0 - 52.0 % Final   MCV 11/13/2021 84.6  80.0 - 100.0 fL Final   MCH 11/13/2021 28.8  26.0 - 34.0 pg Final   MCHC 11/13/2021 34.0  30.0 - 36.0 g/dL Final   RDW 56/21/3086 14.2  11.5 - 15.5 % Final   Platelets 11/13/2021 267  150 - 400 K/uL Final   nRBC 11/13/2021 0.0  0.0 - 0.2 % Final   Neutrophils Relative % 11/13/2021 83  % Final   Neutro Abs 11/13/2021 11.2 (H)  1.7 - 7.7 K/uL Final   Lymphocytes Relative 11/13/2021 13  % Final   Lymphs Abs 11/13/2021 1.7  0.7 - 4.0 K/uL Final   Monocytes Relative 11/13/2021 4  % Final   Monocytes Absolute 11/13/2021 0.5  0.1 - 1.0 K/uL Final   Eosinophils Relative 11/13/2021  0  % Final   Eosinophils Absolute  11/13/2021 0.0  0.0 - 0.5 K/uL Final   Basophils Relative 11/13/2021 0  % Final   Basophils Absolute 11/13/2021 0.0  0.0 - 0.1 K/uL Final   Immature Granulocytes 11/13/2021 0  % Final   Abs Immature Granulocytes 11/13/2021 0.04  0.00 - 0.07 K/uL Final   Performed at [name], [address] [place], [place] [address]   Sodium 11/13/2021 136  135 - 145 mmol/L Final   Potassium 11/13/2021 3.9  3.5 - 5.1 mmol/L Final   Chloride 11/13/2021 99  98 - 111 mmol/L Final   CO2 11/13/2021 25  22 - 32 mmol/L Final   Glucose, Bld 11/13/2021 138 (H)  70 - 99 mg/dL Final   Glucose reference range applies only to samples taken after fasting for at least 8 hours.   BUN 11/13/2021 7  6 - 20 mg/dL Final   Creatinine, Ser 11/13/2021 0.77  0.61 - 1.24 mg/dL Final   Calcium 19/14/7829 9.0  8.9 - 10.3 mg/dL Final   Total Protein 56/21/3086 5.9 (L)  6.5 - 8.1 g/dL Final   Albumin 57/84/6962 3.9  3.5 - 5.0 g/dL Final   AST 95/28/4132 22  15 - 41 U/L Final   ALT 11/13/2021 16  0 - 44 U/L Final   Alkaline Phosphatase 11/13/2021 52  38 - 126 U/L Final   Total Bilirubin 11/13/2021 0.5  0.3 - 1.2 mg/dL Final   GFR, Estimated 11/13/2021 >60  >60 mL/min Final   Comment: (NOTE) Calculated using the CKD-EPI Creatinine Equation (2021)    Anion gap 11/13/2021 12  5 - 15 Final   Performed at [facility], [address] [place], [place] [address]   Hgb A1c MFr Bld 11/13/2021 5.3  4.8 - 5.6 % Final   Comment: (NOTE) Pre diabetes:          5.7%-6.4%  Diabetes:              >6.4%  Glycemic control for   <7.0% adults with diabetes    Mean Plasma Glucose 11/13/2021 105.41  mg/dL Final   Performed at [facility], [address] [place], [place] [address]   Magnesium 11/13/2021 2.1  1.7 - 2.4 mg/dL Final   Performed at [facility], [address], [place], [place] [address]   Alcohol, Ethyl (B) 11/13/2021 13 (H)  <10 mg/dL Final   Comment: (NOTE) Lowest detectable limit for serum  alcohol is 10 mg/dL.  For medical purposes only. Performed at [facility], [address], [place], [place] [address]    Cholesterol 11/13/2021 133  0 - 200 mg/dL Final   Triglycerides 59/56/3875 114  <150 mg/dL Final   HDL 64/33/2951 62  >40 mg/dL Final   Total CHOL/HDL Ratio 11/13/2021 2.1  RATIO Final   VLDL 11/13/2021 23  0 - 40 mg/dL Final   LDL Cholesterol 11/13/2021 48  0 - 99 mg/dL Final   Comment:        Total Cholesterol/HDL:CHD Risk Coronary Heart Disease Risk Table                     Men   Women  1/2 Average Risk   3.4   3.3  Average Risk       5.0   4.4  2 X Average Risk   9.6   7.1  3 X Average Risk  23.4  11.0        Use the calculated Patient Ratio above and the CHD Risk Table to determine the patient's CHD Risk.        ATP III CLASSIFICATION (LDL):  <100     mg/dL   Optimal  161-096  mg/dL   Near or Above                    Optimal  130-159  mg/dL   Borderline  045-409  mg/dL   High  >811     mg/dL   Very High Performed at [facility], [address] [place], [place] [address]    TSH 11/13/2021 0.239 (L)  0.350 - 4.500 uIU/mL Final   Comment: Performed by a 3rd Generation assay with a functional sensitivity of <=0.01 uIU/mL. Performed at [facility], [address], [place], [place] [address]    POC Amphetamine UR 11/13/2021 None Detected  NONE DETECTED (Cut Off Level 1000 ng/mL) Preliminary   POC Secobarbital (BAR) 11/13/2021 None Detected  NONE DETECTED (Cut Off Level 300 ng/mL) Preliminary   POC Buprenorphine (BUP) 11/13/2021 None Detected  NONE DETECTED (Cut Off Level 10 ng/mL) Preliminary   POC Oxazepam (BZO) 11/13/2021 None Detected  NONE DETECTED (Cut Off Level 300 ng/mL) Preliminary   POC Cocaine UR 11/13/2021 Positive (A)  NONE DETECTED (Cut Off Level 300 ng/mL) Preliminary   POC Methamphetamine UR 11/13/2021 None Detected  NONE DETECTED (Cut Off Level 1000 ng/mL) Preliminary   POC Morphine 11/13/2021 None Detected  NONE  DETECTED (Cut Off Level 300 ng/mL) Preliminary   POC Oxycodone UR 11/13/2021 None Detected  NONE DETECTED (Cut Off Level 100 ng/mL) Preliminary   POC Methadone UR 11/13/2021 None Detected  NONE DETECTED (Cut Off Level 300 ng/mL) Preliminary   POC Marijuana UR 11/13/2021 Positive (A)  NONE DETECTED (Cut Off Level 50 ng/mL) Preliminary   SARS Coronavirus 2 Ag 11/13/2021 Negative  Negative Preliminary   SARSCOV2ONAVIRUS 2 AG 11/13/2021 NEGATIVE  NEGATIVE Final   Comment: (NOTE) SARS-CoV-2 antigen NOT DETECTED.   Negative results are presumptive.  Negative results do not preclude SARS-CoV-2 infection and should not be used as the sole basis for treatment or other patient management decisions, including infection  control decisions, particularly in the presence of clinical signs and  symptoms consistent with COVID-19, or in those who have been in contact with the virus.  Negative results must be combined with clinical observations, patient history, and epidemiological information. The expected result is Negative.  Fact Sheet for Patients: https://www.jennings-kim.com/  Fact Sheet for Healthcare Providers: https://alexander-rogers.biz/  This test is not yet approved or cleared by the [place] FDA and  has been authorized for detection and/or diagnosis of SARS-CoV-2 by FDA under an Emergency Use Authorization (EUA).  This EUA will remain in effect (meaning this test can be used) for the duration of  the COV                          ID-19 declaration under Section 564(b)(1) of the Act, 21 U.S.C. section 360bbb-3(b)(1), unless the authorization is terminated or revoked sooner.      Blood Alcohol level:  Lab Results  Component Value Date   ETH 13 (H) 11/13/2021   ETH 139 (H) 03/22/2015    Metabolic Disorder Labs: Lab Results  Component Value Date   HGBA1C 5.3 11/13/2021   MPG 105.41 11/13/2021   No results found for: PROLACTIN  Lab Results   Component Value Date   CHOL 133 11/13/2021   TRIG 114 11/13/2021   HDL 62 11/13/2021   CHOLHDL 2.1 11/13/2021   VLDL 23 11/13/2021   LDLCALC 48 11/13/2021    Therapeutic Lab Levels: No results found for: LITHIUM No results found for: VALPROATE No components found for:  CBMZ  Physical Findings   PHQ2-9    Flowsheet Row ED from 11/13/2021 in [facility]  PHQ-2 Total Score 1  PHQ-9 Total Score 9      Flowsheet Row ED from 11/13/2021 in [facility] ED from 10/27/2021 in [facility] ED from 08/04/2021 in [facility] RISK CATEGORY No Risk No Risk No Risk        Musculoskeletal  Strength & Muscle Tone: within normal limits Gait & Station: normal Patient leans: N/A  Psychiatric Specialty Exam  Presentation  General Appearance: Appropriate for Environment; Casual  Eye Contact:Good  Speech:Clear and Coherent; Normal Rate  Speech Volume:Normal  Handedness:Right   Mood and Affect  Mood:-- ("alright")  Affect:Appropriate; Congruent (euthymic)   Thought Process  Thought Processes:Coherent; Goal Directed; Linear  Descriptions of Associations:Intact  Orientation:Full (Time, Place and Person)  Thought Content:WDL; Logical  Diagnosis of Schizophrenia or Schizoaffective disorder in past: No    Hallucinations:Hallucinations: None  Ideas of Reference:None  Suicidal Thoughts:Suicidal Thoughts: No  Homicidal Thoughts:Homicidal Thoughts: No   Sensorium  Memory:Immediate Good; Recent Good; Remote Good  Judgment:Good  Insight:Good   Executive Functions  Concentration:Good  Attention Span:Good  Recall:Good  Fund of Knowledge:Good  Language:Good   Psychomotor Activity  Psychomotor Activity:Psychomotor Activity: Normal   Assets  Assets:Communication Skills; Desire for Improvement; Financial Resources/Insurance; Housing; Leisure Time;  Physical Health; Resilience; Social Support   Sleep  Sleep:Sleep: Fair   Nutritional Assessment (For OBS and [facility] admissions only) Has the patient had a weight loss or gain of 10 pounds or more in the last 3 months?: No Has the patient had a decrease in food intake/or appetite?: No Does the patient have dental problems?: No Does the patient have eating habits or behaviors that may be indicators of an eating disorder including binging or inducing vomiting?: No Has the patient recently lost weight without trying?: 0 Has the patient been eating poorly because of a decreased appetite?: 0 Malnutrition Screening Tool Score: 0    Physical Exam  Physical Exam Constitutional:      Appearance: Normal appearance. He is normal weight.  HENT:     Head: Normocephalic and atraumatic.  Eyes:     Extraocular Movements: Extraocular movements intact.  Cardiovascular:     Rate and Rhythm: Normal rate and regular rhythm.     Heart sounds: Normal heart sounds.  Pulmonary:     Effort: Pulmonary effort is normal.     Breath sounds: Normal breath sounds.  Abdominal:     General: Abdomen is flat. Bowel sounds are normal.     Palpations: Abdomen is soft.  Neurological:     General: No focal deficit present.     Mental Status: He is alert and oriented to person, place, and time.  Psychiatric:        Attention and Perception: Attention and perception normal.        Speech: Speech normal.        Behavior: Behavior normal. Behavior is cooperative.        Thought Content: Thought content normal.   Review  of Systems  Constitutional:  Negative for chills and fever.  HENT:  Negative for hearing loss.   Eyes:  Negative for discharge and redness.  Respiratory:  Negative for cough.   Cardiovascular:  Negative for chest pain.  Gastrointestinal:  Negative for abdominal pain, nausea and vomiting.  Musculoskeletal:  Negative for myalgias.  Neurological:  Negative for headaches.  Psychiatric/Behavioral:   Positive for substance abuse. Negative for hallucinations and suicidal ideas.   Blood pressure 114/77, pulse 83, temperature (!) 97.5 F (36.4 C), temperature source Temporal, resp. rate 18, SpO2 100 %. There is no height or weight on file to calculate BMI.  Treatment Plan Summary: 41 yo male with history of substance abuse who presented to the [facility] on 11/13/2021 with request for assistance with detox from alcohol and daily drinking-patient was admitted tot he [facility] for detox and crisis stablization. UDS+cocaine and marijuana. Etoh 13. Ativan detox protocol initiated on admission.   Patient denies SI/HI/AVH. He has been tolerating ativan taper well although did decline dose this AM d/t not like breakfast and desire to not take medications without food. Most recent CIWA 0. Ativan taper to end Tuesday 3/21. Patient remains appropriate for continued treatment on the [facility] for detox and crisis stabilization.   AUD, severe -continue CIWA protocol -continue ativan detox- 1 mg QID --> 1 mg TID--> 1 mg BID--> 1 mg daily -multivitamin -thiamine  Nicotine dependence -nicoderm cq patch  Dispo: ongoing. LCSW assisting. Anticipate discharge on Tuesday 3/21 to [facility] after completion of taper  Estella Husk, MD 11/14/2021 2:20 PM

## 2021-11-15 DIAGNOSIS — F1090 Alcohol use, unspecified, uncomplicated: Secondary | ICD-10-CM | POA: Diagnosis not present

## 2021-11-15 DIAGNOSIS — Z20822 Contact with and (suspected) exposure to covid-19: Secondary | ICD-10-CM | POA: Diagnosis not present

## 2021-11-15 DIAGNOSIS — F1410 Cocaine abuse, uncomplicated: Secondary | ICD-10-CM | POA: Diagnosis not present

## 2021-11-15 LAB — PROLACTIN: Prolactin: 1.8 ng/mL — ABNORMAL LOW (ref 4.0–15.2)

## 2021-11-15 NOTE — Discharge Instructions (Signed)
?  In the event of worsening symptoms, patient is instructed to call the crisis hotline, 911 and or go to the nearest ED for appropriate evaluation and treatment of symptoms. ?To follow-up with his/her primary care provider for your other medical issues, concerns and or health care needs. ? ? ?   ?

## 2021-11-15 NOTE — ED Notes (Addendum)
Patient declined to attend wrap up group due to him wanting to rest, even after encouragement from staff. ?

## 2021-11-15 NOTE — Discharge Summary (Signed)
Lance Walton to be D/C'd Home per NP order. Discussed with the patient and all questions fully answered. An After Visit Summary was printed and given to the patient. Patient escorted out and D/C home via private auto.  ?Lance Walton  Lance Walton  ?11/15/2021 10:22 AM ?  ?   ?

## 2021-11-15 NOTE — ED Notes (Signed)
Patient continues to rest with no sxs of distress - will continue to monitor for safety ?

## 2021-11-15 NOTE — ED Notes (Signed)
Patient resting well - no sxs of distress - will continue to monitor for safety ?

## 2021-11-15 NOTE — ED Provider Notes (Signed)
FBC/OBS ASAP Discharge Summary ? ?Date and Time: 11/15/2021 10:05 AM  ?Name: Lance Walton  ?MRN:  [id number]  ? ?Discharge Diagnoses:  ?Final diagnoses:  ?Alcohol use  ?Cocaine use disorder ([place])  ? ? ?Subjective: Lance Walton, 41 y.o., male patient seen face to face by this provider, chart reviewed and discussed with treatment team on 11/15/21.  He has a history of substance abuse who presented to the [facility] on 11/13/2021 with request for assistance with detox from alcohol and daily drinking-patient was admitted tot he [facility] for detox and crisis stablization. UDS+cocaine and marijuana. Etoh 13. Ativan detox protocol initiated on admission.  ? ?Upon assessment patient is fully dressed with his bag of clothes using the phone on the unit.  He is requesting discharge. He is alert/oriented x4 and cooperative.  He is speaking at a moderate pace and tone.  He appears anxious.  He reports eating and sleeping without difficulty.  He denies any withdrawal symptoms.  He has refused Ativan when it has been time to be administered.  He is denying symptoms suicidal/homicidal/self-harm.  He contracts for safety.  Objectively he does not appear to be responding to internal/external stimuli.  He denies paranoid and delusional thought content. ? ?Patient states he talked to his "girl" today states she has been evicted from their residence.  States, "I cannot have her on the streets by herself".  States he still wants to seek residential treatment and he will make his appointment on Tuesday 11/18/2021 with [facility].  However at this time he is requesting to be discharged so he can help his girlfriend obtain housing before he goes into a long-term residential treatment program. Reassurance, support, and encouragement provided.  ?    ?Stay Summary  ? ?Lance Walton was evaluated for stability and plans for continued recovery upon discharge. The following was addressed as part of her discharge planning and follow up treatment:  Employment,  housing, transportation, bed availability, health status, family support, and any pending legal issues were also considered during his during the continuous assessment/observation. He was offered further treatment options upon discharge including but not limited to Residential, Intensive Outpatient, Outpatient treatment, Rehabilitation services, and resources for shelters and [facility] if needed. He has an intake appointment for Tuesday 11/17/2021 at [facility] for residential treatment.  Patient is requesting to be discharged at this time.He is both mentally and medically stable for discharge denying suicidal/homicidal ideation, auditory/visual/tactile hallucinations, delusional thoughts and paranoia.    ? ?Total Time spent with patient: 30 minutes ? ?Past Psychiatric History: see h&p ?Past Medical History:  ?Past Medical History:  ?Diagnosis Date  ? Pneumonia   ?  ?Past Surgical History:  ?Procedure Laterality Date  ? DIRECT LARYNGOSCOPY N/A 03/23/2015  ? Procedure: DIRECT LARYNGOSCOPY;  Surgeon: Izora Gala, MD;  Location: [place];  Service: ENT;  Laterality: N/A;  ? ESOPHAGOSCOPY N/A 03/23/2015  ? Procedure: ESOPHAGOSCOPY;  Surgeon: Izora Gala, MD;  Location: [place];  Service: ENT;  Laterality: N/A;  ? I & D EXTREMITY Left 03/23/2015  ? Procedure: IRRIGATION AND DEBRIDEMENT WOUND EXPLORATION OF NECK;  Surgeon: Donnie Mesa, MD;  Location: [place];  Service: General;  Laterality: Left;  ? ?Family History:  ?Family History  ?Family history unknown: Yes  ? ?Family Psychiatric History: See H&P ?Social History:  ?Social History  ? ?Substance and Sexual Activity  ?Alcohol Use Yes  ?   ?Social History  ? ?Substance and Sexual Activity  ?Drug Use No  ?  ?Social History  ? ?  Socioeconomic History  ? Marital status: Single  ?  Spouse name: Not on file  ? Number of children: Not on file  ? Years of education: Not on file  ? Highest education level: Not on file  ?Occupational History  ? Not on file  ?Tobacco Use  ?  Smoking status: Every Day  ? Smokeless tobacco: Never  ?Substance and Sexual Activity  ? Alcohol use: Yes  ? Drug use: No  ? Sexual activity: Not on file  ?Other Topics Concern  ? Not on file  ?Social History Narrative  ? Not on file  ? ?Social Determinants of Health  ? ?Financial Resource Strain: Not on file  ?Food Insecurity: Not on file  ?Transportation Needs: Not on file  ?Physical Activity: Not on file  ?Stress: Not on file  ?Social Connections: Not on file  ? ?SDOH:  ?SDOH Screenings  ? ?Alcohol Screen: Not on file  ?Depression (PHQ2-9): Medium Risk  ? PHQ-2 Score: 9  ?Financial Resource Strain: Not on file  ?Food Insecurity: Not on file  ?Housing: Not on file  ?Physical Activity: Not on file  ?Social Connections: Not on file  ?Stress: Not on file  ?Tobacco Use: High Risk  ? Smoking Tobacco Use: Every Day  ? Smokeless Tobacco Use: Never  ? Passive Exposure: Not on file  ?Transportation Needs: Not on file  ? ? ?Tobacco Cessation:  N/A, patient does not currently use tobacco products ? ?Current Medications:  ?Current Facility-Administered Medications  ?Medication Dose Route Frequency Provider Last Rate Last Admin  ? acetaminophen (TYLENOL) tablet 650 mg  650 mg Oral Q6H PRN Lucky Rathke, FNP      ? alum & mag hydroxide-simeth (MAALOX/MYLANTA) 200-200-20 MG/5ML suspension 30 mL  30 mL Oral Q4H PRN Lucky Rathke, FNP      ? hydrOXYzine (ATARAX) tablet 25 mg  25 mg Oral TID PRN Lucky Rathke, FNP      ? loperamide (IMODIUM) capsule 2-4 mg  2-4 mg Oral PRN Lucky Rathke, FNP      ? LORazepam (ATIVAN) tablet 1 mg  1 mg Oral Q6H PRN Lucky Rathke, FNP      ? LORazepam (ATIVAN) tablet 1 mg  1 mg Oral QID Lucky Rathke, FNP   1 mg at 11/14/21 2137  ? Followed by  ? LORazepam (ATIVAN) tablet 1 mg  1 mg Oral TID Lucky Rathke, FNP      ? Followed by  ? [START ON 11/16/2021] LORazepam (ATIVAN) tablet 1 mg  1 mg Oral BID Lucky Rathke, FNP      ? Followed by  ? Derrill Memo ON 11/18/2021] LORazepam (ATIVAN) tablet 1 mg  1 mg Oral  Daily Lucky Rathke, FNP      ? magnesium hydroxide (MILK OF MAGNESIA) suspension 30 mL  30 mL Oral Daily PRN Lucky Rathke, FNP      ? multivitamin with minerals tablet 1 tablet  1 tablet Oral Daily Lucky Rathke, FNP   1 tablet at 11/13/21 2057  ? nicotine (NICODERM CQ - dosed in mg/24 hours) patch 21 mg  21 mg Transdermal Daily Ival Bible, MD   21 mg at 11/14/21 1451  ? ondansetron (ZOFRAN-ODT) disintegrating tablet 4 mg  4 mg Oral Q6H PRN Lucky Rathke, FNP      ? thiamine tablet 100 mg  100 mg Oral Daily Lucky Rathke, FNP      ? traZODone (DESYREL) tablet 50  mg  50 mg Oral QHS PRN Lucky Rathke, FNP      ? ?No current outpatient medications on file.  ? ? ?PTA Medications: (Not in a hospital admission) ? ? ?Musculoskeletal  ?Strength & Muscle Tone: within normal limits ?Gait & Station: normal ?Patient leans: N/A ? ?Psychiatric Specialty Exam  ?Presentation  ?General Appearance: Appropriate for Environment; Casual ? ?Eye Contact:Good ? ?Speech:Clear and Coherent; Normal Rate ? ?Speech Volume:Normal ? ?Handedness:Right ? ? ?Mood and Affect  ?Mood:Anxious ? ?Affect:Congruent ? ? ?Thought Process  ?Thought Processes:Coherent ? ?Descriptions of Associations:Intact ? ?Orientation:Full (Time, Place and Person) ? ?Thought Content:Logical ? Diagnosis of Schizophrenia or Schizoaffective disorder in past: No ?  ? Hallucinations:Hallucinations: None ? ?Ideas of Reference:None ? ?Suicidal Thoughts:Suicidal Thoughts: No ? ?Homicidal Thoughts:Homicidal Thoughts: No ? ? ?Sensorium  ?Memory:Immediate Good; Recent Good; Remote Good ? ?Judgment:Good ? ?Insight:Good ? ? ?Executive Functions  ?Concentration:Good ? ?Attention Span:Good ? ?Recall:Good ? ?[facility] [place] ? ?Language:Good ? ? ?Psychomotor Activity  ?Psychomotor Activity:Psychomotor Activity: Normal ? ? ?Assets  ?Assets:Communication Skills; Desire for Improvement; Financial Resources/Insurance; Intimacy; Leisure Time; Physical Health ? ? ?Sleep   ?Sleep:Sleep: Fair ? ? ?No data recorded ? ?Physical Exam  ?Physical Exam ?Vitals and nursing note reviewed.  ?Constitutional:   ?   General: He is not in acute distress. ?   Appearance: Normal appearance. H

## 2021-11-15 NOTE — Progress Notes (Signed)
Pt is awake alert and oriented. Pt has been on the phone since he woke up. Pt has been irritable and appears angry. Pt grabbed his belongings and walk to the exit door. Writer asked pt what he was doing and he responded " I want to leave." Pt was informed that provider needs to discharge him before he can leave. Pt got back on the phone and writer overhead him saying "all they want to do is give me pills. Fuck pills. I do not want to stay here." Hoyle Sauer NP notified via secure chat. Will continue to monitor. ?

## 2021-12-09 ENCOUNTER — Ambulatory Visit: Payer: Self-pay | Admitting: Physician Assistant

## 2022-02-04 ENCOUNTER — Encounter (HOSPITAL_COMMUNITY): Payer: Self-pay | Admitting: Emergency Medicine

## 2022-02-04 ENCOUNTER — Other Ambulatory Visit: Payer: Self-pay

## 2022-02-04 ENCOUNTER — Emergency Department (HOSPITAL_COMMUNITY)
Admission: EM | Admit: 2022-02-04 | Discharge: 2022-02-04 | Discharge status: Against Medical Advice | Payer: Self-pay | Attending: Emergency Medicine | Admitting: Emergency Medicine

## 2022-02-04 DIAGNOSIS — Z5321 Procedure and treatment not carried out due to patient leaving prior to being seen by health care provider: Secondary | ICD-10-CM | POA: Insufficient documentation

## 2022-02-04 DIAGNOSIS — H5712 Ocular pain, left eye: Secondary | ICD-10-CM | POA: Insufficient documentation

## 2022-02-04 DIAGNOSIS — X58XXXA Exposure to other specified factors, initial encounter: Secondary | ICD-10-CM | POA: Insufficient documentation

## 2022-02-04 DIAGNOSIS — T1502XA Foreign body in cornea, left eye, initial encounter: Secondary | ICD-10-CM | POA: Insufficient documentation

## 2022-02-04 NOTE — ED Triage Notes (Signed)
Pt reports foreign objet in left eye x 1 week.

## 2022-02-04 NOTE — ED Notes (Signed)
No answer to name called x1

## 2022-02-04 NOTE — ED Provider Triage Note (Signed)
Emergency Medicine Provider Triage Evaluation Note  [facility] , a 41 y.o. male  was evaluated in triage.  Pt complains of left eye pain.  Concerned he has a foreign body in it, that started a week ago.  Does not wear contacts or glasses, does not remember putting anything in his eye.  He has blurry vision when he looks far to the left, no fevers at home.  He states he flipped his eyelid and saw a black object in his upper eyelid.  Review of Systems  Per HPI  Physical Exam  BP 113/84   Pulse (!) 120   Temp 99.1 F (37.3 C)   Resp 16   SpO2 100%  Gen:   Awake, no distress   Resp:  Normal effort  MSK:   Moves extremities without difficulty  Other:  EOMI, left eye is injected.  Medical Decision Making  Medically screening exam initiated at 3:21 PM.  Appropriate orders placed.  [place] was informed that the remainder of the evaluation will be completed by another provider, this initial triage assessment does not replace that evaluation, and the importance of remaining in the ED until their evaluation is complete.     Sherrill Raring, PA-C 02/04/22 [phone]

## 2022-05-25 ENCOUNTER — Other Ambulatory Visit (HOSPITAL_COMMUNITY)
Admission: EM | Admit: 2022-05-25 | Discharge: 2022-05-27 | Disposition: A | Discharge status: Home / Self Care | Payer: No Payment, Other | Attending: Psychiatry | Admitting: Psychiatry

## 2022-05-25 DIAGNOSIS — F1020 Alcohol dependence, uncomplicated: Secondary | ICD-10-CM | POA: Diagnosis present

## 2022-05-25 DIAGNOSIS — F1094 Alcohol use, unspecified with alcohol-induced mood disorder: Secondary | ICD-10-CM | POA: Diagnosis present

## 2022-05-25 DIAGNOSIS — F1420 Cocaine dependence, uncomplicated: Secondary | ICD-10-CM | POA: Diagnosis not present

## 2022-05-25 DIAGNOSIS — Z20822 Contact with and (suspected) exposure to covid-19: Secondary | ICD-10-CM | POA: Insufficient documentation

## 2022-05-25 DIAGNOSIS — F1290 Cannabis use, unspecified, uncomplicated: Secondary | ICD-10-CM | POA: Diagnosis present

## 2022-05-25 DIAGNOSIS — F1990 Other psychoactive substance use, unspecified, uncomplicated: Secondary | ICD-10-CM | POA: Insufficient documentation

## 2022-05-25 LAB — COMPREHENSIVE METABOLIC PANEL
ALT: 14 U/L (ref 0–44)
AST: 20 U/L (ref 15–41)
Albumin: 3.9 g/dL (ref 3.5–5.0)
Alkaline Phosphatase: 59 U/L (ref 38–126)
Anion gap: 7 (ref 5–15)
BUN: <5 mg/dL — ABNORMAL LOW (ref 6–20)
CO2: 31 mmol/L (ref 22–32)
Calcium: 9 mg/dL (ref 8.9–10.3)
Chloride: 100 mmol/L (ref 98–111)
Creatinine, Ser: 0.86 mg/dL (ref 0.61–1.24)
GFR, Estimated: >60 mL/min (ref >60)
Glucose, Bld: 96 mg/dL (ref 70–99)
Potassium: 3.9 mmol/L (ref 3.5–5.1)
Sodium: 138 mmol/L (ref 135–145)
Total Bilirubin: 0.7 mg/dL (ref 0.3–1.2)
Total Protein: 5.9 g/dL — ABNORMAL LOW (ref 6.5–8.1)

## 2022-05-25 LAB — LIPID PANEL
Cholesterol: 128 mg/dL (ref 0–200)
HDL: 63 mg/dL (ref >40)
LDL Cholesterol: 56 mg/dL (ref 0–99)
Total CHOL/HDL Ratio: 2 RATIO
Triglycerides: 47 mg/dL (ref <150)
VLDL: 9 mg/dL (ref 0–40)

## 2022-05-25 LAB — POCT URINE DRUG SCREEN - MANUAL ENTRY (I-SCREEN)
POC Amphetamine UR: NOT DETECTED
POC Buprenorphine (BUP): NOT DETECTED
POC Cocaine UR: POSITIVE — AB
POC Marijuana UR: POSITIVE — AB
POC Methadone UR: NOT DETECTED
POC Methamphetamine UR: NOT DETECTED
POC Morphine: NOT DETECTED
POC Oxazepam (BZO): NOT DETECTED
POC Oxycodone UR: NOT DETECTED
POC Secobarbital (BAR): NOT DETECTED

## 2022-05-25 LAB — CBC WITH DIFFERENTIAL/PLATELET
Abs Immature Granulocytes: 0.01 10*3/uL (ref 0.00–0.07)
Basophils Absolute: 0 10*3/uL (ref 0.0–0.1)
Basophils Relative: 0 %
Eosinophils Absolute: 0 10*3/uL (ref 0.0–0.5)
Eosinophils Relative: 0 %
HCT: 47.3 % (ref 39.0–52.0)
Hemoglobin: 16.3 g/dL (ref 13.0–17.0)
Immature Granulocytes: 0 %
Lymphocytes Relative: 26 %
Lymphs Abs: 2.1 10*3/uL (ref 0.7–4.0)
MCH: 28.8 pg (ref 26.0–34.0)
MCHC: 34.5 g/dL (ref 30.0–36.0)
MCV: 83.6 fL (ref 80.0–100.0)
Monocytes Absolute: 0.5 10*3/uL (ref 0.1–1.0)
Monocytes Relative: 6 %
Neutro Abs: 5.6 10*3/uL (ref 1.7–7.7)
Neutrophils Relative %: 68 %
Platelets: 251 10*3/uL (ref 150–400)
RBC: 5.66 MIL/uL (ref 4.22–5.81)
RDW: 14.2 % (ref 11.5–15.5)
WBC: 8.2 10*3/uL (ref 4.0–10.5)
nRBC: 0 % (ref 0.0–0.2)

## 2022-05-25 LAB — HEPATITIS PANEL, ACUTE
HCV Ab: NONREACTIVE
Hep A IgM: NONREACTIVE
Hep B C IgM: NONREACTIVE
Hepatitis B Surface Ag: NONREACTIVE

## 2022-05-25 LAB — ETHANOL: Alcohol, Ethyl (B): <10 mg/dL (ref <10)

## 2022-05-25 LAB — POC SARS CORONAVIRUS 2 AG: SARSCOV2ONAVIRUS 2 AG: NEGATIVE

## 2022-05-25 LAB — RESP PANEL BY RT-PCR (FLU A&B, COVID) ARPGX2
Influenza A by PCR: NEGATIVE
Influenza B by PCR: NEGATIVE
SARS Coronavirus 2 by RT PCR: NEGATIVE

## 2022-05-25 LAB — TSH: TSH: 0.629 u[IU]/mL (ref 0.350–4.500)

## 2022-05-25 LAB — MAGNESIUM: Magnesium: 1.9 mg/dL (ref 1.7–2.4)

## 2022-05-25 MED ORDER — ONDANSETRON 4 MG PO TBDP: 4.0000 mg | ORAL_TABLET | Freq: Four times a day (QID) | ORAL | Status: DC | PRN

## 2022-05-25 MED ORDER — THIAMINE MONONITRATE 100 MG PO TABS
100.0000 mg | ORAL_TABLET | Freq: Every day | ORAL | Status: DC
  Administered 2022-05-26: 100 mg via ORAL
  Filled 2022-05-25: qty 14
  Filled 2022-05-25: qty 1

## 2022-05-25 MED ORDER — LOPERAMIDE HCL 2 MG PO CAPS: 2.0000 mg | ORAL_CAPSULE | ORAL | Status: DC | PRN

## 2022-05-25 MED ORDER — TRAZODONE HCL 50 MG PO TABS: 50.0000 mg | ORAL_TABLET | Freq: Every evening | ORAL | Status: DC | PRN

## 2022-05-25 MED ORDER — ALUM & MAG HYDROXIDE-SIMETH 200-200-20 MG/5ML PO SUSP: 30.0000 mL | ORAL | Status: DC | PRN

## 2022-05-25 MED ORDER — HYDROXYZINE HCL 25 MG PO TABS: 25.0000 mg | ORAL_TABLET | Freq: Three times a day (TID) | ORAL | Status: DC | PRN

## 2022-05-25 MED ORDER — ADULT MULTIVITAMIN W/MINERALS CH
1.0000 | ORAL_TABLET | Freq: Every day | ORAL | Status: DC
  Administered 2022-05-25 – 2022-05-26 (×2): 1 via ORAL
  Filled 2022-05-25 (×2): qty 1
  Filled 2022-05-25: qty 14

## 2022-05-25 MED ORDER — ACETAMINOPHEN 325 MG PO TABS: 650.0000 mg | ORAL_TABLET | Freq: Four times a day (QID) | ORAL | Status: DC | PRN

## 2022-05-25 MED ORDER — LORAZEPAM 1 MG PO TABS: 1.0000 mg | ORAL_TABLET | Freq: Four times a day (QID) | ORAL | Status: DC | PRN

## 2022-05-25 MED ORDER — MAGNESIUM HYDROXIDE 400 MG/5ML PO SUSP: 30.0000 mL | Freq: Every day | ORAL | Status: DC | PRN

## 2022-05-25 MED ORDER — THIAMINE HCL 100 MG/ML IJ SOLN
100.0000 mg | Freq: Once | INTRAMUSCULAR | Status: AC
  Administered 2022-05-25: 100 mg via INTRAMUSCULAR
  Filled 2022-05-25: qty 2

## 2022-05-25 NOTE — ED Notes (Signed)
Pt arrived [facility] 

## 2022-05-25 NOTE — ED Provider Notes (Signed)
Facility Based Crisis Admission H&P  Date: 05/25/22 Patient Name: Lance Walton MRN: [id number] Chief Complaint:  Chief Complaint  Patient presents with   Addiction Problem      Diagnoses:  Final diagnoses:  Alcohol use disorder, moderate, dependence (Lance Walton)  Substance use disorder    HPI:  Lance Walton 41 y.o., male patient presented to [facility] as a walk in, voluntarily, unaccompanied with complaints of alcohol abuse and substance use requesting detox services.   Lance Walton, 40 y.o., male patient seen face to face by this provider, consulted with Dr. Dwyane Dee; and chart reviewed on 05/25/22.    On evaluation Lance Walton reports, he has struggled with some form of substance use since adolescents. He initially started smoking marijuana at age 32, was introduced to cocaine around 55 or 41 years of age, and at age 46 drink alcohol.  He reports at present he drinks 2 to 440 ounces of beer on a daily basis.  He reports he does not drink to get drunk however drinks to "escape reality".  He reports he smokes" power blunt" which consists of cocaine and marijuana, at least 3 days per week or more often, 4-6 per day. He last used all substances less than 10 hours ago. Denies history of alcohol withdrawal induced seizures.  He denies any pertinent medical history.  He currently takes no medications for any chronic conditions.  He reports that he is motivated to achieve sobriety as he is a single father and his mother currently takes care of his daughter and her health is failing and he needs to be sober in order to become gainfully employed and to be his daughter's primary caregiver.  During evaluation Lance Walton is upright (position) in no acute distress.  He is alert, oriented x 4, calm, cooperative and attentive.  His mood is euthymic with congruent affect.  She has normal speech, and behavior.  Objectively there is no evidence of psychosis/mania or delusional thinking.  Patient is able to  converse coherently, goal directed thoughts, no distractibility, or pre-occupation. He also denies suicidal/self-harm/homicidal ideation, psychosis, and paranoia. Patient answered question appropriately.      PHQ 2-9:  Lance Walton from 05/25/2022 in Lance Walton from 11/13/2021 in [facility]  Thoughts that you would be better off dead, or of hurting yourself in some way Not at all Not at all  PHQ-9 Total Score 12 9       Lance Walton Walton from 05/25/2022 in Lance Walton from 02/04/2022 in Lance Walton from 11/13/2021 in Lance Walton No Risk No Risk No Risk        Total Time spent with patient: 30 minutes  Musculoskeletal  Strength & Muscle Tone: within normal limits Gait & Station: normal Patient leans: N/A  Psychiatric Specialty Exam  Presentation General Appearance: Appropriate for Environment  Eye Contact:Good  Speech:Normal Rate  Speech Volume:Normal  Handedness:Right   Mood and Affect  Mood:Anxious  Affect:Congruent   Thought Process  Thought Processes:Goal Directed  Descriptions of Associations:Intact  Orientation:Full (Time, Place and Person)  Thought Content:Logical  Diagnosis of Schizophrenia or Schizoaffective disorder in past: No   Hallucinations:Hallucinations: None  Ideas of Reference:None  Suicidal Thoughts:Suicidal Thoughts: No  Homicidal Thoughts:Homicidal Thoughts: No   Sensorium  Memory:Recent Good; Remote Good; Immediate Good  Judgment:Good  Insight:Good   Executive Functions  Concentration:Good  Attention Span:Good  Lance Walton  Language:Good   Psychomotor Activity  Psychomotor Activity:Psychomotor Activity: Normal  Assets  Assets:Communication Skills; Resilience; Social Support; Housing; Desire for Improvement;  Physical Health   Sleep  Sleep:Sleep: Poor Number of Hours of Sleep: 3 (currently residing [facility])  Nutritional Assessment (For OBS and [facility] admissions only) Has Lance patient had a weight loss or gain of 10 pounds or more in Lance last 3 months?: No Has Lance patient had a decrease in food intake/or appetite?: No Does Lance patient have dental problems?: No Does Lance patient have eating habits or behaviors that may be indicators of an eating disorder including binging or inducing vomiting?: No Has Lance patient recently lost weight without trying?: 0 Has Lance patient been eating poorly because of a decreased appetite?: 0 Malnutrition Screening Tool Score: 0    Physical Exam Vitals and nursing note reviewed.  Constitutional:      Appearance: Normal appearance.  HENT:     Head: Normocephalic.  Cardiovascular:     Rate and Rhythm: Regular rhythm. Tachycardia present.  Pulmonary:     Effort: Pulmonary effort is normal.     Breath sounds: Normal breath sounds.  Musculoskeletal:     Cervical back: Normal range of motion.  Skin:    Capillary Refill: Capillary refill takes less than 2 seconds.  Neurological:     General: No focal deficit present.     Mental Status: He is alert and oriented to person, place, and time.  Psychiatric:        Mood and Affect: Mood is anxious.        Behavior: Behavior normal. Behavior is cooperative.        Thought Content: Thought content normal.        Judgment: Judgment normal.    Review of Systems  HENT: Negative.    Respiratory: Negative.    Cardiovascular: Negative.   Gastrointestinal: Negative.   Genitourinary: Negative.   Skin: Negative.   Neurological: Negative.   Psychiatric/Behavioral:  Positive for substance abuse. Lance patient has insomnia.   All other systems reviewed and are negative.   Blood pressure 107/83, pulse (!) 114, temperature 98.6 F (37 C), resp. rate 18, height '5\' 10"'$  (1.778 m), weight 149 lb (67.6 kg), SpO2 96 %. Body  mass index is 21.38 kg/m.  Past Psychiatric History:  Admitted to [facility] 11/13/2021, transferred to [facility], left without completing program after one week.  Is Lance patient at risk to self? No  Has Lance patient been a risk to self in Lance past 6 months? No .    Has Lance patient been a risk to self within Lance distant past? No   Is Lance patient a risk to others? No   Has Lance patient been a risk to others in Lance past 6 months? No   Has Lance patient been a risk to others within Lance distant past? No   Past Medical History:  Past Medical History:  Diagnosis Date   Pneumonia     Past Surgical History:  Procedure Laterality Date   DIRECT LARYNGOSCOPY N/A 03/23/2015   Procedure: DIRECT LARYNGOSCOPY;  Surgeon: Izora Gala, MD;  Location: [place];  Service: ENT;  Laterality: N/A;   ESOPHAGOSCOPY N/A 03/23/2015   Procedure: ESOPHAGOSCOPY;  Surgeon: Izora Gala, MD;  Location: [place];  Service: ENT;  Laterality: N/A;   I & D EXTREMITY Left 03/23/2015   Procedure: IRRIGATION AND DEBRIDEMENT WOUND EXPLORATION OF NECK;  Surgeon: Donnie Mesa, MD;  Location:  [facility];  Service: General;  Laterality: Left;    Family History:  Family History  Family history unknown: Yes    Social History:  Social History   Socioeconomic History   Marital status: Single    Spouse name: Not on file   Number of children: Not on file   Years of education: Not on file   Highest education level: Not on file  Occupational History   Not on file  Tobacco Use   Smoking status: Every Day   Smokeless tobacco: Never  Substance and Sexual Activity   Alcohol use: Yes   Drug use: No   Sexual activity: Not on file  Other Topics Concern   Not on file  Social History Narrative   Not on file   Social Determinants of Health   Financial Resource Strain: Not on file  Food Insecurity: Not on file  Transportation Needs: Not on file  Physical Activity: Not on file  Stress: Not on file  Social Connections: Not on file  Intimate  Partner Violence: Not on file    SDOH:  SDOH Screenings   Depression (PHQ2-9): High Risk (05/25/2022)  Tobacco Use: High Risk (02/04/2022)    Last Labs:    Allergies: Patient has no known allergies.  PTA Medications: (Not in a hospital admission)   Long Term Goals: Improvement in symptoms so as ready for discharge, complete outpatient treatment, attend AA meetings, achieve and maintain sobriety, able to care for 48 year old child independently.  Short Term Goals: Patient will verbalize feelings in meetings with treatment team members., Patient will attend at least of 50% of Lance groups daily., Pt will complete Lance PHQ9 on admission, day 3 and discharge., and Patient will take medications as prescribed daily.  Medical Decision Making   Patient case review and discussed with Dr. Dwyane Dee.  Patient does meet inpatient criteria for inpatient psychiatric treatment. Patient poses no immediate safety risk and is being admitted for therapeutic detox voluntary. Recommendations  Based on my evaluation Lance patient does not appear to have an emergency medical condition, meets criteria for [facility] admission for Detox.     Molli Barrows, FNP 05/25/22  5:28 PM

## 2022-05-25 NOTE — ED Notes (Signed)
Pt is in the bed sleeping. Respirations are even and unlabored. No acute distress noted. Will continue to monitor for safety. 

## 2022-05-25 NOTE — ED Notes (Signed)
Pt A&O x 4, with substance abuse disorder  Denies SI/HI or AVH.  Pt calm & cooperative, monitoring for safety. Patient oriented to unit and given meal. Plan of care discussed with patient. Patient voices no complaints or concerns at this time.
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
## 2022-05-25 NOTE — BH Assessment (Signed)
Effie, Urgent, 41 year old presents to [facility] voluntarily and unaccompanied at this time with complaints of detox for alcohol, marijuana and cocaine.  Pt reports his last use was 10 hours ago.   Pt denies SI, HI, or AVH. Pt denies any prior MH diagnosis or prescribed medication for symptom management.  MSE signed by patient.

## 2022-05-26 ENCOUNTER — Encounter (HOSPITAL_COMMUNITY): Payer: Self-pay

## 2022-05-26 ENCOUNTER — Encounter (HOSPITAL_COMMUNITY): Payer: Self-pay | Admitting: Family Medicine

## 2022-05-26 DIAGNOSIS — F1290 Cannabis use, unspecified, uncomplicated: Secondary | ICD-10-CM | POA: Diagnosis not present

## 2022-05-26 DIAGNOSIS — F1990 Other psychoactive substance use, unspecified, uncomplicated: Secondary | ICD-10-CM | POA: Diagnosis not present

## 2022-05-26 DIAGNOSIS — F1094 Alcohol use, unspecified with alcohol-induced mood disorder: Secondary | ICD-10-CM | POA: Diagnosis not present

## 2022-05-26 DIAGNOSIS — F1020 Alcohol dependence, uncomplicated: Secondary | ICD-10-CM

## 2022-05-26 DIAGNOSIS — F1420 Cocaine dependence, uncomplicated: Secondary | ICD-10-CM | POA: Diagnosis not present

## 2022-05-26 MED ORDER — GLUCERNA SHAKE PO LIQD
237.0000 mL | Freq: Once | ORAL | Status: AC
  Administered 2022-05-26: 237 mL via ORAL

## 2022-05-26 MED ORDER — VITAMIN B-1 100 MG PO TABS: 100.0000 mg | ORAL_TABLET | Freq: Every day | ORAL | 1 refills | Status: AC

## 2022-05-26 MED ORDER — NALTREXONE HCL 50 MG PO TABS
50.0000 mg | ORAL_TABLET | Freq: Every day | ORAL | Status: DC
  Administered 2022-05-26: 50 mg via ORAL
  Filled 2022-05-26: qty 1
  Filled 2022-05-26: qty 14

## 2022-05-26 MED ORDER — NALTREXONE HCL 50 MG PO TABS: 50.0000 mg | ORAL_TABLET | Freq: Every day | ORAL | 1 refills | Status: AC

## 2022-05-26 MED ORDER — ADULT MULTIVITAMIN W/MINERALS CH: 1.0000 | ORAL_TABLET | Freq: Every day | ORAL | 1 refills | Status: AC

## 2022-05-26 NOTE — ED Provider Notes (Incomplete)
FBC/OBS ASAP Discharge Summary  Date and Time: 05/27/2022, 9:13 AM  Name: Lance Walton  Age: 41 y.o.  DOB: 26-Nov-1980  MRN:  [id number]   Discharge Diagnoses:  Final diagnoses:  Cannabis use disorder  Cocaine use disorder, severe, dependence ([place])  Alcohol-induced mood disorder (HCC)  Alcohol use disorder, severe, dependence ([place])    HPI:  Per H&P: " Lance Walton 41 y.o., male patient presented to [facility] as a walk in, voluntarily, unaccompanied with complaints of alcohol abuse and substance use requesting detox services.    [name], 41 y.o., male patient seen face to face by this provider, consulted with Dr. Dwyane Dee; and chart reviewed on 05/25/22.     On evaluation Lance Walton reports, he has struggled with some form of substance use since adolescents. He initially started smoking marijuana at age 26, was introduced to cocaine around 82 or 41 years of age, and at age 56 drink alcohol.  He reports at present he drinks 2 to 440 ounces of beer on a daily basis.  He reports he does not drink to get drunk however drinks to "escape reality".  He reports he smokes" power blunt" which consists of cocaine and marijuana, at least 3 days per week or more often, 4-6 per day. He last used all substances less than 10 hours ago. Denies history of alcohol withdrawal induced seizures.  He denies any pertinent medical history.  He currently takes no medications for any chronic conditions.  He reports that he is motivated to achieve sobriety as he is a single father and his mother currently takes care of his daughter and her health is failing and he needs to be sober in order to become gainfully employed and to be his daughter's primary caregiver.   During evaluation [place] is upright (position) in no acute distress.  He is alert, oriented x 4, calm, cooperative and attentive.  His mood is euthymic with congruent affect.  She has normal speech, and behavior.  Objectively there is no evidence  of psychosis/mania or delusional thinking.  Patient is able to converse coherently, goal directed thoughts, no distractibility, or pre-occupation. He also denies suicidal/self-harm/homicidal ideation, psychosis, and paranoia. Patient answered question appropriately.  " Psychiatric History:  Dx: timulant use d/o (cocaine), cannabis use d/o, alcohol use d/o (no h/o seizure or DT) Prior Rx: None OP psychiatrist: Denied OP therapist: Denied PCP: Pcp, No  Suicide Attempt: Denied, none seen in chart Inpatient psych: Denied, none seen in chart   Psychiatric Family History:  Substance use: both parents   Social History:   Living: With mom and daughter Income: Unemployed Social support: Mother   EtOH:  reports current alcohol use. age 76 started drinking alcohol.  He reports at present he drinks 2 to 440 ounces of beer on a daily basis Tobacco:  reports that he has been smoking. He has never used smokeless tobacco.  Cannabis: He initially started smoking marijuana at age 53 Opiates: Denied. I have reviewed the PDMP during this encounter. Stimulants: introduced to cocaine around 60 or 41 years of age. He reports he smokes" power blunt" which consists of cocaine and marijuana, at least 3 days per week or more often, 4-6 per day BZO/hypnotics: Denied Seizure/DT: Denied, none seen in chart IVDU: Denied  Subjective:  Patient denied acute concerns. Motivated and ready to go to [facility].   KV:QQVZDGLO Thoughts: No (contracted to safety) VF:IEPPIRJJO Thoughts: No ACZ:YSAYTKZSWFUXNA: None Ideas of TFT:DDUK   Mood: Dysphoric, Irritable Sleep:Fair Appetite: Good  Review of Systems  Constitutional:  Negative for chills, diaphoresis and weight loss.  Respiratory:  Negative for shortness of breath.   Cardiovascular:  Negative for chest pain.  Gastrointestinal:  Negative for nausea and vomiting.  Neurological:  Negative for dizziness, tremors and headaches  Stay Summary:  Lance Walton is a 41  y.o. male with PMH of stimulant use d/o (cocaine), cannabis use d/o, alcohol use d/o (no h/o seizure or DT), no prior suicide attempt or inpatient psych admission, who presented to [facility] (05/25/2022), then admitted to [facility] for cocaine and EtOH detox and assistance with residential placement.   Patient was initially irritable, however after eating, patient was calm, pleasant, cooperative, engaged. No acute behavior concerns.   AUD, severe BAL neg. AST/ALT wnl.  CIWA with ativan PRN per protocol with thiamine & MV supplement Started naltrexone 50 qHS   Stimulant use d/o  Cannabis use d/o UDS + cocaine, cannabis. Denied IVDU. Continued comfort PRNs Encouraged cessation    Considerations for follow-up: Recommend high risk screening every 6-12 months with HIV, RPR, hepatitis panel Recommend monitoring LFT every 6-12 months while on naltrexone     Medication List    START taking these medications    multivitamin with minerals Tabs tablet; Take 1 tablet by mouth daily.  naltrexone 50 MG tablet; Commonly known as: DEPADE; Take 1 tablet (50 mg  total) by mouth daily.  thiamine 100 MG tablet; Commonly known as: Vitamin B-1; Take 1 tablet  (100 mg total) by mouth daily.    Clinical Course as of 05/27/22 0913  Tue May 26, 2022  0811 POC Cocaine UR(!): Positive [JN]  0811 POC Marijuana UR(!): Positive [JN]  0811 Alcohol, Ethyl (B): <10 [JN]  0811 AST: 20 [JN]  0812 ALT: 14 [JN]  0812 Creatinine: 0.86 [JN]    Clinical Course User Index [JN] Merrily Brittle, DO    Past Psychiatric History: Per H&P Past Medical History:  Past Medical History:  Diagnosis Date   Alcohol use disorder, severe, dependence ([place]) 05/26/2022   Cannabis use disorder 05/26/2022   Cocaine use disorder, severe, dependence ([place]) 11/13/2021   Pneumonia     Past Surgical History:  Procedure Laterality Date   DIRECT LARYNGOSCOPY N/A 03/23/2015   Procedure: DIRECT LARYNGOSCOPY;  Surgeon: Izora Gala, MD;  Location: [facility];  Service: ENT;  Laterality: N/A;   ESOPHAGOSCOPY N/A 03/23/2015   Procedure: ESOPHAGOSCOPY;  Surgeon: Izora Gala, MD;  Location: [facility];  Service: ENT;  Laterality: N/A;   I & D EXTREMITY Left 03/23/2015   Procedure: IRRIGATION AND DEBRIDEMENT WOUND EXPLORATION OF NECK;  Surgeon: Donnie Mesa, MD;  Location: [facility];  Service: General;  Laterality: Left;   Family History:  Family History  Family history unknown: Yes   Family Psychiatric History: Per H&P Social History:  Social History   Substance and Sexual Activity  Alcohol Use Yes     Social History   Substance and Sexual Activity  Drug Use No    Social History   Socioeconomic History   Marital status: Single    Spouse name: Not on file   Number of children: Not on file   Years of education: Not on file   Highest education level: Not on file  Occupational History   Not on file  Tobacco Use   Smoking status: Every Day   Smokeless tobacco: Never  Substance and Sexual Activity   Alcohol use: Yes   Drug use: No   Sexual activity: Not on file  Other Topics Concern   Not on file  Social History Narrative   Not on file   Social Determinants of Health   Financial Resource Strain: Not on file  Food Insecurity: Not on file  Transportation Needs: Not on file  Physical Activity: Not on file  Stress: Not on file  Social Connections: Not on file   SDOH:  SDOH Screenings   Depression (PHQ2-9): High Risk (05/25/2022)  Tobacco Use: High Risk (05/26/2022)   Tobacco Cessation:  A prescription for an FDA-approved tobacco cessation medication was offered at discharge and the patient refused  Current Medications:  Current Facility-Administered Medications  Medication Dose Route Frequency Provider Last Rate Last Admin   acetaminophen (TYLENOL) tablet 650 mg  650 mg Oral Q6H PRN Scot Jun, FNP       alum & mag hydroxide-simeth (MAALOX/MYLANTA) 200-200-20 MG/5ML suspension 30 mL  30 mL Oral Q4H PRN Scot Jun, FNP       hydrOXYzine (ATARAX) tablet 25 mg  25 mg Oral TID PRN Scot Jun, FNP       loperamide (IMODIUM) capsule 2-4 mg  2-4 mg Oral PRN Scot Jun, FNP       LORazepam (ATIVAN) tablet 1 mg  1 mg Oral Q6H PRN Scot Jun, FNP       magnesium hydroxide (MILK OF MAGNESIA) suspension 30 mL  30 mL Oral Daily PRN Scot Jun, FNP       multivitamin with minerals tablet 1 tablet  1 tablet Oral Daily Scot Jun, FNP   1 tablet at 05/26/22 0913   naltrexone (DEPADE) tablet 50 mg  50 mg Oral Daily Merrily Brittle, DO   50 mg at 05/26/22 1210   ondansetron (ZOFRAN-ODT) disintegrating tablet 4 mg  4 mg Oral Q6H PRN Scot Jun, FNP       thiamine (VITAMIN B1) tablet 100 mg  100 mg Oral Daily Scot Jun, FNP   100 mg at 05/26/22 0913   traZODone (DESYREL) tablet 50 mg  50 mg Oral QHS PRN Scot Jun, FNP       Current Outpatient Medications  Medication Sig Dispense Refill   Multiple Vitamin (MULTIVITAMIN WITH MINERALS) TABS tablet Take 1 tablet by mouth daily. 30 tablet 1   naltrexone (DEPADE) 50 MG tablet Take 1 tablet (50 mg total) by mouth daily. 30 tablet 1   thiamine (VITAMIN B-1) 100 MG tablet Take 1 tablet (100 mg total) by mouth daily. 30 tablet 1    PTA Medications: (Not in a hospital admission)     05/25/2022    5:26 PM 05/25/2022    4:34 PM 11/13/2021    9:16 PM  Depression screen PHQ 2/9  Decreased Interest 3 3 0  Down, Depressed, Hopeless 0 0 1  PHQ - 2 Score '3 3 1  '$ Altered sleeping '3 3 2  '$ Tired, decreased energy '2 2 2  '$ Change in appetite '3 3 2  '$ Feeling bad or failure about yourself  0 0 1  Trouble concentrating '1 1 1  '$ Moving slowly or fidgety/restless 0 0 0  Suicidal thoughts 0 0 0  PHQ-9 Score '12 12 9  '$ Difficult doing work/chores Somewhat difficult Somewhat difficult Somewhat difficult    Flowsheet Row ED from 05/25/2022 in [facility] ED from 02/04/2022 in [facility] from 11/13/2021 in [place] No Risk No Risk No Risk  Musculoskeletal  Strength & Muscle Tone: within normal limits Gait & Station: normal Patient leans: N/A   Psychiatric Specialty Exam   Presentation  General Appearance:Disheveled Eye Contact:Minimal Speech:Normal Rate, Clear and Coherent Volume:Normal Handedness:Right  Mood and Affect  Mood:Dysphoric, Irritable Affect:Appropriate, Congruent, Constricted  Thought Process  Thought Process:Coherent, Goal Directed Descriptions of Associations:Intact  Thought Content Suicidal Thoughts:Suicidal Thoughts: No (contracted to safety) Homicidal Thoughts:Homicidal Thoughts: No Hallucinations:Hallucinations: None Ideas of Reference:None Thought Content:Logical, WDL  Sensorium  Memory:Immediate Fair Judgment:Fair Insight:Shallow  Executive Functions  Orientation:Full (Time, Place and Person) Language:Good Concentration:Good [place] of Knowledge:Good  Psychomotor Activity  Psychomotor Activity:Psychomotor Activity: Normal  Assets  Assets:Communication Skills, Desire for Improvement, Housing, Physical Health, Resilience  Sleep  Quality:Fair  Physical Exam  BP 112/77 (BP Location: Left Arm)   Pulse 83   Temp 98.6 F (37 C) (Oral)   Resp 18   Ht '5\' 10"'$  (1.778 m)   Wt 149 lb (67.6 kg)   SpO2 100%   BMI 21.38 kg/m   Physical Exam Vitals and nursing note reviewed.  Constitutional:      General: He is not in acute distress.    Appearance: He is not ill-appearing, toxic-appearing or diaphoretic.  HENT:     Head: Normocephalic.  Eyes:     Comments: Conjuntival injection   Pulmonary:     Effort: Pulmonary effort is normal. No respiratory distress.  Neurological:     Mental Status: He is alert and oriented to person, place, and time.   Demographic Factors:  Male, Divorced or widowed, Low socioeconomic  status, and Unemployed  Loss Factors: NA  Historical Factors: Family history of mental illness or substance abuse and Impulsivity  Risk Reduction Factors:   Responsible for children under 43 years of age, Sense of responsibility to family, Religious beliefs about death, Living with another person, especially a relative, Positive social support, and Positive therapeutic relationship  Continued Clinical Symptoms:  Alcohol/Substance Abuse/Dependencies  Cognitive Features That Contribute To Risk:  Loss of executive function    Suicide Risk:  Mild:  Suicidal ideation of limited frequency, intensity, duration, and specificity.  There are no identifiable plans, no associated intent, mild dysphoria and related symptoms, good self-control (both objective and subjective assessment), few other risk factors, and identifiable protective factors, including available and accessible social support.  Plan Of Care/Follow-up recommendations:  Activity and diet at tolerated.  Please: Take all medications as prescribed by your mental healthcare provider. Report any adverse effects and or reactions from the medicines to your outpatient provider promptly. Do not engage in alcohol and or illegal drug use while on prescription medicines.  Disposition: Door-to-door [facility] for residential rehab   Total Time spent with patient: 30 minutes  Signed: Merrily Brittle, DO Psychiatry Resident, PGY-2 [facility] 05/27/2022, 9:13 AM

## 2022-05-26 NOTE — ED Provider Notes (Signed)
[facility] Based Crisis Behavioral Health Progress Note  Date & Time: 05/26/2022 3:19 PM Name: Lance Walton Age: 41 y.o.  DOB: Nov 21, 1980  MRN: [id number]  Diagnosis:  Final diagnoses:  Cannabis use disorder  Cocaine use disorder, severe, dependence ([place])  Alcohol-induced mood disorder ([place])  Alcohol use disorder, severe, dependence ([place])    Reason for presentation: Addiction Problem  Brief HPI  Lance Walton is a 41 y.o. male, with PMH of stimulant use d/o (cocaine), cannabis use d/o, alcohol use d/o (no h/o seizure or DT), no prior suicide attempt or inpatient psych admission, who presented to [facility] (05/25/2022), then admitted to [facility] for cocaine and EtOH detox and assistance with residential placement.  He reports that he is motivated to achieve sobriety as he is a single father and his mother currently takes care of his daughter and her health is failing and he needs to be sober in order to become gainfully employed and to be his daughter's primary caregiver.  Interval Hx   Patient Narrative:   Patient was initially seen this AM, on the phone and irritable, requesting food that is only available at lunch time. He declined breakfast options, stated that he wanted to return to [facility] side or be Dc'd. After encouragement and a protein shake, patient was calm and decided to stay. Patient requested residential at [facility]. Denied craving for cocaine, cannabis or EtOH. Denied shakes/tremors. Discussed risk and benefit of naltrexone, patient amenable to starting it.   MW:[id number] Thoughts: No (contracted to safety) UY:QIHKVQQVZ Thoughts: No DGL:OVFIEPPIRJJOAC: None  Mood: Dysphoric, Irritable Sleep:Fair Appetite: Good  Review of Systems  Constitutional:  Negative for chills, diaphoresis and weight loss.  Respiratory:  Negative for shortness of breath.   Cardiovascular:  Negative for chest pain.  Gastrointestinal:  Negative for nausea and vomiting.  Neurological:  Negative  for dizziness, tremors and headaches.     Past History   Psychiatric History:  Dx: timulant use d/o (cocaine), cannabis use d/o, alcohol use d/o (no h/o seizure or DT) Prior Rx: None OP psychiatrist: Denied OP therapist: Denied PCP: Pcp, No  Suicide Attempt: Denied, none seen in chart Inpatient psych: Denied, none seen in chart  Psychiatric Family History:  Substance use: both parents  Social History:   Living: With mom and daughter Income: Unemployed Social support: Mother  EtOH:  reports current alcohol use. age 48 started drinking alcohol.  He reports at present he drinks 2 to 440 ounces of beer on a daily basis Tobacco:  reports that he has been smoking. He has never used smokeless tobacco.  Cannabis: He initially started smoking marijuana at age 35 Opiates: Denied. I have reviewed the PDMP during this encounter. Stimulants: introduced to cocaine around 54 or 41 years of age. He reports he smokes" power blunt" which consists of cocaine and marijuana, at least 3 days per week or more often, 4-6 per day BZO/hypnotics: Denied Seizure/DT: Denied, none seen in chart IVDU: Denied  Past Medical History:  Past Medical History:  Diagnosis Date   Alcohol use disorder, severe, dependence ([place]) 05/26/2022   Cannabis use disorder 05/26/2022   Cocaine use disorder, severe, dependence ([place]) 11/13/2021   Pneumonia     Past Surgical History:  Procedure Laterality Date   DIRECT LARYNGOSCOPY N/A 03/23/2015   Procedure: DIRECT LARYNGOSCOPY;  Surgeon: Izora Gala, MD;  Location: [place];  Service: ENT;  Laterality: N/A;   ESOPHAGOSCOPY N/A 03/23/2015   Procedure: ESOPHAGOSCOPY;  Surgeon: Izora Gala, MD;  Location: [place];  Service: ENT;  Laterality: N/A;   I & D EXTREMITY Left 03/23/2015   Procedure: IRRIGATION AND DEBRIDEMENT WOUND EXPLORATION OF NECK;  Surgeon: Donnie Mesa, MD;  Location: [facility];  Service: General;  Laterality: Left;   Family History:  Family History  Family history  unknown: Yes   Social History   Substance and Sexual Activity  Alcohol Use Yes    Social History   Substance and Sexual Activity  Drug Use No    Social History   Socioeconomic History   Marital status: Single    Spouse name: Not on file   Number of children: Not on file   Years of education: Not on file   Highest education level: Not on file  Occupational History   Not on file  Tobacco Use   Smoking status: Every Day   Smokeless tobacco: Never  Substance and Sexual Activity   Alcohol use: Yes   Drug use: No   Sexual activity: Not on file  Other Topics Concern   Not on file  Social History Narrative   Not on file   Social Determinants of Health   Financial Resource Strain: Not on file  Food Insecurity: Not on file  Transportation Needs: Not on file  Physical Activity: Not on file  Stress: Not on file  Social Connections: Not on file   SDOH: SDOH Screenings   Depression (PHQ2-9): High Risk (05/25/2022)  Tobacco Use: High Risk (05/26/2022)   Additional Social History: Alcohol / Drug Use Pain Medications: See MAR Prescriptions: See MAR Over the Counter: See MAR History of alcohol / drug use?: Yes Longest period of sobriety (when/how long): Unknown Negative Consequences of Use:  (None noted) Withdrawal Symptoms: Nausea / Vomiting, Fever / Chills, Patient aware of relationship between substance abuse and physical/medical complications Substance #1 Name of Substance 1: alcohol 1 - Age of First Use: 25 1 - Amount (size/oz): 2 to 4 - 40 oz beers 1 - Frequency: daily 1 - Duration: ongoing 1 - Last Use / Amount: today 1 - Method of Aquiring: unknown 1- Route of Use: drink/oral Substance #2 Name of Substance 2: cannabis & cocaine rolled together 2 - Age of First Use: Cannabis 15 & cocaine 19 2 - Amount (size/oz): varies 2 - Frequency: 3-4 times a week 2 - Duration: ongoing 2 - Last Use / Amount: today 2 - Method of Aquiring: unknown 2 - Route of Substance  Use: smoke Current Medications   Current Facility-Administered Medications  Medication Dose Route Frequency Provider Last Rate Last Admin   acetaminophen (TYLENOL) tablet 650 mg  650 mg Oral Q6H PRN Scot Jun, FNP       alum & mag hydroxide-simeth (MAALOX/MYLANTA) 200-200-20 MG/5ML suspension 30 mL  30 mL Oral Q4H PRN Scot Jun, FNP       hydrOXYzine (ATARAX) tablet 25 mg  25 mg Oral TID PRN Scot Jun, FNP       loperamide (IMODIUM) capsule 2-4 mg  2-4 mg Oral PRN Scot Jun, FNP       LORazepam (ATIVAN) tablet 1 mg  1 mg Oral Q6H PRN Scot Jun, FNP       magnesium hydroxide (MILK OF MAGNESIA) suspension 30 mL  30 mL Oral Daily PRN Scot Jun, FNP       multivitamin with minerals tablet 1 tablet  1 tablet Oral Daily Scot Jun, FNP   1 tablet at 05/26/22 0913   naltrexone (DEPADE) tablet 50 mg  50 mg Oral Daily Merrily Brittle, DO   50 mg at 05/26/22 1210   ondansetron (ZOFRAN-ODT) disintegrating tablet 4 mg  4 mg Oral Q6H PRN Scot Jun, FNP       thiamine (VITAMIN B1) tablet 100 mg  100 mg Oral Daily Scot Jun, FNP   100 mg at 05/26/22 0913   traZODone (DESYREL) tablet 50 mg  50 mg Oral QHS PRN Scot Jun, FNP       Current Outpatient Medications  Medication Sig Dispense Refill   [START ON 05/27/2022] Multiple Vitamin (MULTIVITAMIN WITH MINERALS) TABS tablet Take 1 tablet by mouth daily. 30 tablet 1   naltrexone (DEPADE) 50 MG tablet Take 1 tablet (50 mg total) by mouth daily. 30 tablet 1   [START ON 05/27/2022] thiamine (VITAMIN B-1) 100 MG tablet Take 1 tablet (100 mg total) by mouth daily. 30 tablet 1    Labs / Images  Lab Results:  Admission on 05/25/2022  Component Date Value Ref Range Status   SARS Coronavirus 2 by RT PCR 05/25/2022 NEGATIVE  NEGATIVE Final   Comment: (NOTE) SARS-CoV-2 target nucleic acids are NOT DETECTED.  The SARS-CoV-2 RNA is generally detectable in upper  respiratory specimens during the acute phase of infection. The lowest concentration of SARS-CoV-2 viral copies this assay can detect is 138 copies/mL. A negative result does not preclude SARS-Cov-2 infection and should not be used as the sole basis for treatment or other patient management decisions. A negative result may occur with  improper specimen collection/handling, submission of specimen other than nasopharyngeal swab, presence of viral mutation(s) within the areas targeted by this assay, and inadequate number of viral copies(<138 copies/mL). A negative result must be combined with clinical observations, patient history, and epidemiological information. The expected result is Negative.  Fact Sheet for Patients:  EntrepreneurPulse.com.au  Fact Sheet for Healthcare Providers:  IncredibleEmployment.be  This test is no                          t yet approved or cleared by the [place] FDA and  has been authorized for detection and/or diagnosis of SARS-CoV-2 by FDA under an Emergency Use Authorization (EUA). This EUA will remain  in effect (meaning this test can be used) for the duration of the COVID-19 declaration under Section 564(b)(1) of the Act, 21 U.S.C.section 360bbb-3(b)(1), unless the authorization is terminated  or revoked sooner.       Influenza A by PCR 05/25/2022 NEGATIVE  NEGATIVE Final   Influenza B by PCR 05/25/2022 NEGATIVE  NEGATIVE Final   Comment: (NOTE) The Xpert Xpress SARS-CoV-2/FLU/RSV plus assay is intended as an aid in the diagnosis of influenza from Nasopharyngeal swab specimens and should not be used as a sole basis for treatment. Nasal washings and aspirates are unacceptable for Xpert Xpress SARS-CoV-2/FLU/RSV testing.  Fact Sheet for Patients: EntrepreneurPulse.com.au  Fact Sheet for Healthcare Providers: IncredibleEmployment.be  This test is not yet approved or  cleared by the [place] FDA and has been authorized for detection and/or diagnosis of SARS-CoV-2 by FDA under an Emergency Use Authorization (EUA). This EUA will remain in effect (meaning this test can be used) for the duration of the COVID-19 declaration under Section 564(b)(1) of the Act, 21 U.S.C. section 360bbb-3(b)(1), unless the authorization is terminated or revoked.  Performed at [facility], [place] [address], [place], [place] [address]    WBC 05/25/2022 8.2  4.0 - 10.5 K/uL Final  RBC 05/25/2022 5.66  4.22 - 5.81 MIL/uL Final   Hemoglobin 05/25/2022 16.3  13.0 - 17.0 g/dL Final   HCT 05/25/2022 47.3  39.0 - 52.0 % Final   MCV 05/25/2022 83.6  80.0 - 100.0 fL Final   MCH 05/25/2022 28.8  26.0 - 34.0 pg Final   MCHC 05/25/2022 34.5  30.0 - 36.0 g/dL Final   RDW 05/25/2022 14.2  11.5 - 15.5 % Final   Platelets 05/25/2022 251  150 - 400 K/uL Final   nRBC 05/25/2022 0.0  0.0 - 0.2 % Final   Neutrophils Relative % 05/25/2022 68  % Final   Neutro Abs 05/25/2022 5.6  1.7 - 7.7 K/uL Final   Lymphocytes Relative 05/25/2022 26  % Final   Lymphs Abs 05/25/2022 2.1  0.7 - 4.0 K/uL Final   Monocytes Relative 05/25/2022 6  % Final   Monocytes Absolute 05/25/2022 0.5  0.1 - 1.0 K/uL Final   Eosinophils Relative 05/25/2022 0  % Final   Eosinophils Absolute 05/25/2022 0.0  0.0 - 0.5 K/uL Final   Basophils Relative 05/25/2022 0  % Final   Basophils Absolute 05/25/2022 0.0  0.0 - 0.1 K/uL Final   Immature Granulocytes 05/25/2022 0  % Final   Abs Immature Granulocytes 05/25/2022 0.01  0.00 - 0.07 K/uL Final   Performed at [facility], [place] [address], [place], [place] [address]   Sodium 05/25/2022 138  135 - 145 mmol/L Final   Potassium 05/25/2022 3.9  3.5 - 5.1 mmol/L Final   Chloride 05/25/2022 100  98 - 111 mmol/L Final   CO2 05/25/2022 31  22 - 32 mmol/L Final   Glucose, Bld 05/25/2022 96  70 - 99 mg/dL Final   Glucose reference range applies only to samples taken  after fasting for at least 8 hours.   BUN 05/25/2022 <5 (L)  6 - 20 mg/dL Final   Creatinine, Ser 05/25/2022 0.86  0.61 - 1.24 mg/dL Final   Calcium 05/25/2022 9.0  8.9 - 10.3 mg/dL Final   Total Protein 05/25/2022 5.9 (L)  6.5 - 8.1 g/dL Final   Albumin 05/25/2022 3.9  3.5 - 5.0 g/dL Final   AST 05/25/2022 20  15 - 41 U/L Final   ALT 05/25/2022 14  0 - 44 U/L Final   Alkaline Phosphatase 05/25/2022 59  38 - 126 U/L Final   Total Bilirubin 05/25/2022 0.7  0.3 - 1.2 mg/dL Final   GFR, Estimated 05/25/2022 >60  >60 mL/min Final   Comment: (NOTE) Calculated using the CKD-EPI Creatinine Equation (2021)    Anion gap 05/25/2022 7  5 - 15 Final   Performed at [facility] [address], [place], [place] [address]   Alcohol, Ethyl (B) 05/25/2022 <10  <10 mg/dL Final   Comment: (NOTE) Lowest detectable limit for serum alcohol is 10 mg/dL.  For medical purposes only. Performed at [facility], [place] [address], [place], [place] [address]    Cholesterol 05/25/2022 128  0 - 200 mg/dL Final   Triglycerides 05/25/2022 47  <150 mg/dL Final   HDL 05/25/2022 63  >40 mg/dL Final   Total CHOL/HDL Ratio 05/25/2022 2.0  RATIO Final   VLDL 05/25/2022 9  0 - 40 mg/dL Final   LDL Cholesterol 05/25/2022 56  0 - 99 mg/dL Final   Comment:        Total Cholesterol/HDL:CHD Risk Coronary Heart Disease Risk Table  Men   Women  1/2 Average Risk   3.4   3.3  Average Risk       5.0   4.4  2 X Average Risk   9.6   7.1  3 X Average Risk  23.4   11.0        Use the calculated Patient Ratio above and the CHD Risk Table to determine the patient's CHD Risk.        ATP III CLASSIFICATION (LDL):  <100     mg/dL   Optimal  100-129  mg/dL   Near or Above                    Optimal  130-159  mg/dL   Borderline  160-189  mg/dL   High  >190     mg/dL   Very High Performed at [facility] [address] [place], [place] [address]    TSH 05/25/2022 0.629  0.350 - 4.500  uIU/mL Final   Comment: Performed by a 3rd Generation assay with a functional sensitivity of <=0.01 uIU/mL. Performed at [facility], [place] [address], [place], [place] [address]    Hepatitis B Surface Ag 05/25/2022 NON REACTIVE  NON REACTIVE Final   HCV Ab 05/25/2022 NON REACTIVE  NON REACTIVE Final   Comment: (NOTE) Nonreactive HCV antibody screen is consistent with no HCV infections,  unless recent infection is suspected or other evidence exists to indicate HCV infection.     Hep A IgM 05/25/2022 NON REACTIVE  NON REACTIVE Final   Hep B C IgM 05/25/2022 NON REACTIVE  NON REACTIVE Final   Performed at [facility], [place] [address] [place], [place] [address]   Magnesium 05/25/2022 1.9  1.7 - 2.4 mg/dL Final   Performed at [facility] [address], [place], [place] [address]   POC Amphetamine UR 05/25/2022 None Detected  NONE DETECTED (Cut Off Level 1000 ng/mL) Final   POC Secobarbital (BAR) 05/25/2022 None Detected  NONE DETECTED (Cut Off Level 300 ng/mL) Final   POC Buprenorphine (BUP) 05/25/2022 None Detected  NONE DETECTED (Cut Off Level 10 ng/mL) Final   POC Oxazepam (BZO) 05/25/2022 None Detected  NONE DETECTED (Cut Off Level 300 ng/mL) Final   POC Cocaine UR 05/25/2022 Positive (A)  NONE DETECTED (Cut Off Level 300 ng/mL) Final   POC Methamphetamine UR 05/25/2022 None Detected  NONE DETECTED (Cut Off Level 1000 ng/mL) Final   POC Morphine 05/25/2022 None Detected  NONE DETECTED (Cut Off Level 300 ng/mL) Final   POC Methadone UR 05/25/2022 None Detected  NONE DETECTED (Cut Off Level 300 ng/mL) Final   POC Oxycodone UR 05/25/2022 None Detected  NONE DETECTED (Cut Off Level 100 ng/mL) Final   POC Marijuana UR 05/25/2022 Positive (A)  NONE DETECTED (Cut Off Level 50 ng/mL) Final   SARSCOV2ONAVIRUS 2 AG 05/25/2022 NEGATIVE  NEGATIVE Final   Comment: (NOTE) SARS-CoV-2 antigen NOT DETECTED.   Negative results are presumptive.  Negative results do not  preclude SARS-CoV-2 infection and should not be used as the sole basis for treatment or other patient management decisions, including infection  control decisions, particularly in the presence of clinical signs and  symptoms consistent with COVID-19, or in those who have been in contact with the virus.  Negative results must be combined with clinical observations, patient history, and epidemiological information. The expected result is Negative.  Fact Sheet for Patients: HandmadeRecipes.com.cy  Fact Sheet for Healthcare  Providers: FuneralLife.at  This test is not yet approved or cleared by the [place] and  has been authorized for detection and/or diagnosis of SARS-CoV-2 by FDA under an Emergency Use Authorization (EUA).  This EUA will remain in effect (meaning this test can be used) for the duration of  the COV                          ID-19 declaration under Section 564(b)(1) of the Act, 21 U.S.C. section 360bbb-3(b)(1), unless the authorization is terminated or revoked sooner.     Blood Alcohol level:  Lab Results  Component Value Date   ETH <10 05/25/2022   ETH 13 (H) 67/67/2094   Metabolic Disorder Labs: Lab Results  Component Value Date   HGBA1C 5.3 11/13/2021   MPG 105.41 11/13/2021   Lab Results  Component Value Date   PROLACTIN 1.8 (L) 11/13/2021   Lab Results  Component Value Date   CHOL 128 05/25/2022   TRIG 47 05/25/2022   HDL 63 05/25/2022   CHOLHDL 2.0 05/25/2022   VLDL 9 05/25/2022   LDLCALC 56 05/25/2022   LDLCALC 48 11/13/2021   Therapeutic Lab Levels: No results found for: "LITHIUM" No results found for: "VALPROATE" No results found for: "CBMZ" Physical Findings   PHQ2-9    Flowsheet Row ED from 05/25/2022 in [facility] ED from [facility]/13/2021 in [facility]  PHQ-2 Total Score 3 1  PHQ-9 Total Score 12 9      Flowsheet Row ED  [facility] from 02/04/2022 in [place] ED from 11/13/2021 in [place] No Risk No Risk No Risk       Musculoskeletal  Strength & Muscle Tone: within normal limits Gait & Station: normal Patient leans: N/A   Psychiatric Specialty Exam   Presentation  General Appearance:Disheveled Eye Contact:Minimal Speech:Normal Rate, Clear and Coherent Volume:Normal Handedness:Right  Mood and Affect  Mood:Dysphoric, Irritable Affect:Appropriate, Congruent, Constricted  Thought Process  Thought Process:Coherent, Goal Directed Descriptions of Associations:Intact  Thought Content Suicidal Thoughts:Suicidal Thoughts: No (contracted to safety) Homicidal Thoughts:Homicidal Thoughts: No Hallucinations:Hallucinations: None Ideas of Reference:None Thought Content:Logical, WDL  Sensorium  Memory:Immediate Fair Judgment:Fair Insight:Shallow  Executive Functions  Orientation:Full (Time, Place and Person) Language:Good Concentration:Good [place] of Knowledge:Good  Psychomotor Activity  Psychomotor Activity:Psychomotor Activity: Normal  Assets  Assets:Communication Skills, Desire for Improvement, Housing, Physical Health, Resilience  Sleep  Quality:Fair  Physical Exam  BP 104/67 (BP Location: Left Arm)   Pulse 71   Temp 98.6 F (37 C) (Oral)   Resp 18   Ht '5\' 10"'$  (1.778 m)   Wt 149 lb (67.6 kg)   SpO2 100%   BMI 21.38 kg/m  Physical Exam Vitals and nursing note reviewed.  Constitutional:      General: He is not in acute distress.    Appearance: He is not ill-appearing, toxic-appearing or diaphoretic.  HENT:     Head: Normocephalic.  Eyes:     Comments: Conjuntival injection   Pulmonary:     Effort: Pulmonary effort is normal. No respiratory distress.  Neurological:     Mental Status: He is alert and  oriented to person, place, and time.      Assessment / Plan  Total Time spent with patient: 30 minutes Treatment Plan Summary: Daily contact with patient to assess and  evaluate symptoms and progress in treatment and Medication management  Principal Problem:   Alcohol-induced mood disorder ([place]) Active Problems:   Cocaine use disorder, severe, dependence ([place])   Cannabis use disorder   Alcohol use disorder, severe, dependence ([place])  Anurag Blucher is a 42 y.o. male with PMH of stimulant use d/o (cocaine), cannabis use d/o, alcohol use d/o (no h/o seizure or DT), no prior suicide attempt or inpatient psych admission, who presented to [facility] (05/25/2022), then admitted to [facility] for cocaine and EtOH detox and assistance with residential placement.  UDS+cocaine, cannabis. BAL neg. Last use 05/24/2022. Last time at [facility] 10/2021 Total duration of encounter: 1 day  AUD, severe BAL neg. AST/ALT wnl.  CIWA with ativan PRN per protocol with thiamine & MV supplement Started naltrexone qHS  Stimulant use d/o  Cannabis use d/o UDS + cocaine, cannabis. Denied IVDU. Continued comfort PRNs Encouraged cessation   Considerations for follow-up: Recommend high risk screening every 6-12 months with HIV, RPR, hepatitis panel Recommend monitoring LFT every 6-12 months while on naltrexone  DISPO: Is amenable to residential rehab. Will attempt to dc patient door-to-door, however if unable will discuss with CD-IOP as bridge to rehab.  Tentative date: 05/27/2022 Location: Door-to-door at [place]: Merrily Brittle, DO Psychiatry Resident, PGY-2 05/26/2022, 3:19 PM   [facility] [place], [place] [address] Dept: [phone] Dept Fax: [phone]

## 2022-05-26 NOTE — ED Notes (Addendum)
Got patient up for breakfast, he said he wanted to sleep some more, he would eat in a few hours, informed patient there is a certain time for breakfast, if he wants breakfast he needs to get up to eat. We have a schedule we adhere to, but if he wants to sleep he can. Patient got up for breakfast was irritable when he came into the dayroom, he said he did not want to eat what we had available, muffins and cereal, and oatmeal, he got upset started yelling, he can't eat that his mouth is messed up, her wanted what he had in [facility], [place] and [place], informed him that is lunch and dinner, and we do not serve that on this side, we serve hot meals at lunch and dinner, he became more enraged, started using foul language, he cannot eat sweets, told him the cereal is not sweet, he did not want to listen he wanted to speak to the nurse, and waited in the hall, the nurse told him the same thing and he continued to get angrier, she called security, and he spoke with the doctor after that, he said he  wanted to go back to [facility] side, when he was told he could not, then he said he wanted to leave.

## 2022-05-26 NOTE — Progress Notes (Signed)
Since his outburst this morning patient has been calm, cooperative and pleasant.  He has made needs known appropriately and is without complaint or distress.  Patient given lunch and ate well without issue.  Patient has spent most of his tome in his room.  He is scheduled to go to [facility] tomorrow morning.  Patient is aware and in agreement with this plan.  Will monitor and provide support as needed.

## 2022-05-26 NOTE — ED Notes (Signed)
Pt is in the bed sleeping. Respirations are even and unlabored. No acute distress noted. Will continue to monitor for safety. 

## 2022-05-26 NOTE — Progress Notes (Signed)
Patient awoke irritable and hostile.  He was not happy with the breakfast choices and was demanding food we could not provide.  Patient profane, angry and posturing.  Security called and MD made aware.  M.D. came to speak with patient and he was offered an ensure which he accepted.  Patient was asking for discharge however it is unclear if he continues to want that.  M.D. to clarify patients plans.  Patient apologized for his behavior after he received the ensure.  He is now sitting in the day room watching TV.  Will monitor.

## 2022-05-26 NOTE — BH IP Treatment Plan (Unsigned)
Interdisciplinary Treatment and Diagnostic Plan Update  05/26/2022 Time of Session: [place] MRN: [id number]  Diagnosis:  Final diagnoses:  Alcohol use disorder, moderate, dependence ([place])     Current Medications:  Current Facility-Administered Medications  Medication Dose Route Frequency Provider Last Rate Last Admin   acetaminophen (TYLENOL) tablet 650 mg  650 mg Oral Q6H PRN Scot Jun, FNP       alum & mag hydroxide-simeth (MAALOX/MYLANTA) 200-200-20 MG/5ML suspension 30 mL  30 mL Oral Q4H PRN Scot Jun, FNP       hydrOXYzine (ATARAX) tablet 25 mg  25 mg Oral TID PRN Scot Jun, FNP       loperamide (IMODIUM) capsule 2-4 mg  2-4 mg Oral PRN Scot Jun, FNP       LORazepam (ATIVAN) tablet 1 mg  1 mg Oral Q6H PRN Scot Jun, FNP       magnesium hydroxide (MILK OF MAGNESIA) suspension 30 mL  30 mL Oral Daily PRN Scot Jun, FNP       multivitamin with minerals tablet 1 tablet  1 tablet Oral Daily Scot Jun, FNP   1 tablet at 05/26/22 0913   ondansetron (ZOFRAN-ODT) disintegrating tablet 4 mg  4 mg Oral Q6H PRN Scot Jun, FNP       thiamine (VITAMIN B1) tablet 100 mg  100 mg Oral Daily Scot Jun, FNP   100 mg at 05/26/22 0913   traZODone (DESYREL) tablet 50 mg  50 mg Oral QHS PRN Scot Jun, FNP       No current outpatient medications on file.   PTA Medications: Prior to Admission medications   Not on File    Patient Stressors: Substance abuse   Other: Mother's health is not good.    Patient Strengths: Psychologist, clinical for treatment/growth  Supportive family/friends  Other: Resilient; motivated to be the father his children needs him to be.  Treatment Modalities: Medication Management, Group therapy, Case management,  1 to 1 session with clinician, Psychoeducation, Recreational therapy.   Physician Treatment Plan for Primary and Secondary Diagnosis:  Final  diagnoses:  Alcohol use disorder, moderate, dependence ([place])   Long Term Goal(s): Improvement in symptoms so as ready for discharge  Short Term Goals: Patient will verbalize feelings in meetings with treatment team members. Patient will attend at least of 50% of the groups daily. Pt will complete the PHQ9 on admission, day 3 and discharge. Patient will take medications as prescribed daily.  Medication Management: Evaluate patient's response, side effects, and tolerance of medication regimen.  Therapeutic Interventions: 1 to 1 sessions, Unit Group sessions and Medication administration.  Evaluation of Outcomes: Not Met  LCSW Treatment Plan for Primary Diagnosis:  Final diagnoses:  Alcohol use disorder, moderate, dependence ([place])    Long Term Goal(s): Safe transition to appropriate next level of care at discharge.  Short Term Goals: Facilitate acceptance of mental health diagnosis and concerns through verbal commitment to aftercare plan and appointments at discharge., Patient will identify one social support prior to discharge to aid in patient's recovery., Patient will attend AA/NA groups as scheduled., Identify minimum of 2 triggers associated with mental health/substance abuse issues with treatment team members., and Increase skills for wellness and recovery by attending 50% of scheduled groups.  Therapeutic Interventions: Assess for all discharge needs, 1 to 1 time with Social worker, Explore available resources and support systems, Assess for adequacy in community support network, Educate family  and significant other(s) on suicide prevention, Complete Psychosocial Assessment, Interpersonal group therapy.  Evaluation of Outcomes: Not Met   Progress in Treatment: Attending groups: Just arrived on [facility]. Participating in groups: Just arrived on [place]. Taking medication as prescribed: Not on medications. Toleration medication: Not on medications. Family/Significant other contact made:  No, will contact:  No one indicated. Patient understands diagnosis: Yes. Discussing patient identified problems/goals with staff: Yes. Medical problems stabilized or resolved: Yes. Denies suicidal/homicidal ideation: Yes. Issues/concerns per patient self-inventory: Yes. Other: Wanting to leave the facility because of the lack of wholesome food.  New problem(s) identified: Yes, Describe:  Very aggressive and confrontational with the staff when he is displeased.  New Short Term/Long Term Goal(s):  To get clean and sober so he can take care of his children.  Patient Goals:  Go to [facility] for the 28-day program.  Discharge Plan or Barriers: Refer to [place]  Reason for Continuation of Hospitalization: Withdrawal symptoms  Estimated Length of Stay:  Last 3 [place] Suicide Severity Risk Score: [place] ED from 05/25/2022 in [facility] ED from 02/04/2022 in [place] [facility] from 11/13/2021 in [facility] No Risk No Risk No Risk       Last PHQ 2/9 Scores:    05/25/2022    5:26 PM 05/25/2022    4:34 PM 11/13/2021    9:16 PM  Depression screen PHQ 2/9  Decreased Interest 3 3 0  Down, Depressed, Hopeless 0 0 1  PHQ - 2 Score '3 3 1  ' Altered sleeping '3 3 2  ' Tired, decreased energy '2 2 2  ' Change in appetite '3 3 2  ' Feeling bad or failure about yourself  0 0 1  Trouble concentrating '1 1 1  ' Moving slowly or fidgety/restless 0 0 0  Suicidal thoughts 0 0 0  PHQ-9 Score '12 12 9  ' Difficult doing work/chores Somewhat difficult Somewhat difficult Somewhat difficult    Scribe for Treatment Team: Kerri Perches, LCSW 05/26/2022 9:30 AM

## 2022-05-26 NOTE — ED Notes (Signed)
Pt admitted to [facility] requesting for detox due to alcohol and substance abuse endorsing. Patient was cooperative during the admission assessment. Skin assessment complete. Belongings in the locker. Patient oriented to unit and unit rules. Meal and drinks offered to patient.  Patient verbalized agreement to treatment plans. Patient verbally contracts for safety while hospitalized. Will monitor for safety.

## 2022-05-26 NOTE — Discharge Instructions (Addendum)
Dear Lance Walton,  Most effective treatment for your mental health disease involves BOTH a psychiatrist AND a therapist Psychiatrist to manage medications Therapist to help identify personal goals, barriers from those goals, and plan to achieve those goals by understanding emotions Please make regular appointments with an outpatient psychiatrist and other doctors once you leave the hospital (if any, otherwise, please see below for resources to make an appointment).  For therapy outside the hospital, please ask for these specific types of therapy: DBT ________________________________________________________  SAFETY CRISIS  Dial 988 for [place]    Text [phone] for Crisis Text Line:     [facility]:  [address] FIRST FLOOR.  [place], [place] [address].  [phone]  Mobile Crisis Response Teams Listed by counties in vicinity of [place]. [phone] [place] [phone] [place] [phone] [facility] [phone] [place] [phone] [place]. [phone] [place].  [place] [phone] ________________________________________________________  To see which pharmacy near you is the CHEAPEST for certain medications, please use GoodRx. It is free website and has a free phone app.    Also consider looking at [facility] $4.00 or [facility]'s $7.00 prescription list. Both are free to view if googled "[facility] $4 prescription" and "public's $7 prescription". These are set prices, no insurance required. [facility]'s low cost medications: $4-$15 for 30days  prescriptions or $10-$38 for 90days prescriptions  ________________________________________________________  Difficulties with sleep?   Can also use this free app for insomnia called CBT-I. Let your doctors and therapists know so they can help with extra tips and tricks or for guidance and accountability. NO ADDS on the app.     ________________________________________________________  Non-Emergent / Urgent  [facility] [address] [place], [place] [address] [phone] OUTPATIENT Walk-in information: Please note, all walk-ins are first come & first serve, with limited number of availability.  Please note that to be eligible for services you must bring: ID or a piece of mail with your name [facility] address  Therapist for therapy:  Monday & Wednesdays: Please ARRIVE at 7:15 AM for registration Will START at 8:00 AM Every 1st & 2nd Friday of the month: Please ARRIVE at 10:15 AM for registration Will START at 1 PM - 5 PM  Psychiatrist for medication management: Monday - Friday:  Please ARRIVE at 7:15 AM for registration Will START at 8:00 AM  Regretfully, due to limited availability, please be aware that you may not been seen on the same day as walk-in. Please consider making an appoint or try again. Thank you for your patience and understanding.

## 2022-05-26 NOTE — ED Notes (Signed)
Received patient this PM. Patient is watching tv with peers in the day room.  Patient respirations are even and unlabored. Will continue to monitor for safety.

## 2022-05-27 DIAGNOSIS — F1420 Cocaine dependence, uncomplicated: Secondary | ICD-10-CM | POA: Diagnosis not present

## 2022-05-27 DIAGNOSIS — F1094 Alcohol use, unspecified with alcohol-induced mood disorder: Secondary | ICD-10-CM | POA: Diagnosis not present

## 2022-05-27 DIAGNOSIS — F1290 Cannabis use, unspecified, uncomplicated: Secondary | ICD-10-CM | POA: Diagnosis not present

## 2022-05-27 DIAGNOSIS — F1990 Other psychoactive substance use, unspecified, uncomplicated: Secondary | ICD-10-CM | POA: Diagnosis not present

## 2022-05-27 NOTE — ED Notes (Signed)
Patient in his bed sleeping. Patient respirations are even and unlabored. Will continue to monitor for safety.

## 2022-06-17 ENCOUNTER — Ambulatory Visit: Payer: Self-pay | Admitting: Physician Assistant

## 2022-06-17 ENCOUNTER — Encounter: Payer: Self-pay | Admitting: Physician Assistant

## 2022-06-17 VITALS — BP 97/68 | HR 71 | Ht 70.0 in | Wt 157.0 lb

## 2022-06-17 DIAGNOSIS — F1290 Cannabis use, unspecified, uncomplicated: Secondary | ICD-10-CM

## 2022-06-17 DIAGNOSIS — F1020 Alcohol dependence, uncomplicated: Secondary | ICD-10-CM

## 2022-06-17 DIAGNOSIS — H02824 Cysts of left upper eyelid: Secondary | ICD-10-CM | POA: Insufficient documentation

## 2022-06-17 DIAGNOSIS — F5104 Psychophysiologic insomnia: Secondary | ICD-10-CM | POA: Insufficient documentation

## 2022-06-17 DIAGNOSIS — F1420 Cocaine dependence, uncomplicated: Secondary | ICD-10-CM
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
## 2022-06-17 NOTE — Patient Instructions (Addendum)
You are going to discontinue the thiamine and naltrexone based on your preference.  You will continue multivitamin on a daily basis, you will purchase this over the counter  I encourage you to use warm compresses several times a day to help with eyelid nodule, until resolved.  Please follow up with eye doctor if it does not resolve promptly.  Kennieth Rad, PA-C Physician Assistant [facility] [email or web address]  Stye A stye, also known as a hordeolum, is a bump that forms on an eyelid. It may look like a pimple next to the eyelash. A stye can form inside the eyelid (internal stye) or outside the eyelid (external stye). A stye can cause redness, swelling, and pain on the eyelid. Styes are very common. Anyone can get them at any age. They usually occur in just one eye at a time, but you may have more than one in either eye. What are the causes? A stye is caused by an infection. The infection is almost always caused by bacteria called Staphylococcus aureus. This is a common type of bacteria that lives on the skin. An internal stye may result from an infected oil-producing gland inside the eyelid. An external stye may be caused by an infection at the base of the eyelash (hair follicle). What increases the risk? You are more likely to develop a stye if: You have had a stye before. You have any of these conditions: Red, itchy, inflamed eyelids (blepharitis). A skin condition such as seborrheic dermatitis or rosacea. High fat levels in your blood (lipids). Dry eyes. What are the signs or symptoms? The most common symptom of a stye is eyelid pain. Internal styes are more painful than external styes. Other symptoms may include: Painful swelling of your eyelid. A scratchy feeling in your eye. Tearing and redness of your eye. A pimple-like bump on the edge of the eyelid. Pus draining from the stye. How is this diagnosed? Your health care  provider may be able to diagnose a stye just by examining your eye. The health care provider may also check to make sure: You do not have a fever or other signs of a more serious infection. The infection has not spread to other parts of your eye or areas around your eye. How is this treated? Most styes will clear up in a few days without treatment or with warm compresses applied to the area. You may need to use antibiotic drops or ointment to treat an infection. Sometimes, steroid drops or ointment are used in addition to antibiotics. In some cases, your health care provider may give you a small steroid injection in the eyelid. If your stye does not heal with routine treatment, your health care provider may drain pus from the stye using a thin blade or needle. This may be done if the stye is large, causing a lot of pain, or affecting your vision. Follow these instructions at home: Take over-the-counter and prescription medicines only as told by your health care provider. This includes eye drops or ointments. If you were prescribed an antibiotic medicine, steroid medicine, or both, apply or use them as told by your health care provider. Do not stop using the medicine even if your condition improves. Apply a warm, wet cloth (warm compress) to your eye for 5-10 minutes, 4 to 6 times a day. Clean the affected eyelid as directed by your health care provider. Do not wear contact lenses or eye makeup until your stye has healed and your health care  provider says that it is safe. Do not try to pop or drain the stye. Do not rub your eye. Contact a health care provider if: You have chills or a fever. Your stye does not go away after several days. Your stye affects your vision. Your eyeball becomes swollen, red, or painful. Get help right away if: You have pain when moving your eye around. Summary A stye is a bump that forms on an eyelid. It may look like a pimple next to the eyelash. A stye can form  inside the eyelid (internal stye) or outside the eyelid (external stye). A stye can cause redness, swelling, and pain on the eyelid. Your health care provider may be able to diagnose a stye just by examining your eye. Apply a warm, wet cloth (warm compress) to your eye for 5-10 minutes, 4 to 6 times a day. This information is not intended to replace advice given to you by your health care provider. Make sure you discuss any questions you have with your health care provider. Document Revised: 10/23/2020 Document Reviewed: 10/23/2020 Elsevier Patient Education  [place].
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
## 2023-01-18 ENCOUNTER — Other Ambulatory Visit (HOSPITAL_COMMUNITY)
Admission: EM | Admit: 2023-01-18 | Discharge: 2023-01-21 | Disposition: A | Discharge status: Home / Self Care | Payer: 59 | Attending: Psychiatry | Admitting: Psychiatry

## 2023-01-18 DIAGNOSIS — F1410 Cocaine abuse, uncomplicated: Secondary | ICD-10-CM | POA: Insufficient documentation

## 2023-01-18 DIAGNOSIS — F1210 Cannabis abuse, uncomplicated: Secondary | ICD-10-CM | POA: Insufficient documentation

## 2023-01-18 DIAGNOSIS — Z5901 Sheltered homelessness: Secondary | ICD-10-CM | POA: Diagnosis not present

## 2023-01-18 DIAGNOSIS — F32A Depression, unspecified: Secondary | ICD-10-CM | POA: Diagnosis not present

## 2023-01-18 DIAGNOSIS — F1010 Alcohol abuse, uncomplicated: Secondary | ICD-10-CM | POA: Insufficient documentation

## 2023-01-18 LAB — CBC WITH DIFFERENTIAL/PLATELET
Abs Immature Granulocytes: 0.03 10*3/uL (ref 0.00–0.07)
Basophils Absolute: 0 10*3/uL (ref 0.0–0.1)
Basophils Relative: 0 %
Eosinophils Absolute: 0 10*3/uL (ref 0.0–0.5)
Eosinophils Relative: 1 %
HCT: 47.1 % (ref 39.0–52.0)
Hemoglobin: 15.3 g/dL (ref 13.0–17.0)
Immature Granulocytes: 0 %
Lymphocytes Relative: 32 %
Lymphs Abs: 2.4 10*3/uL (ref 0.7–4.0)
MCH: 27.6 pg (ref 26.0–34.0)
MCHC: 32.5 g/dL (ref 30.0–36.0)
MCV: 85 fL (ref 80.0–100.0)
Monocytes Absolute: 0.6 10*3/uL (ref 0.1–1.0)
Monocytes Relative: 7 %
Neutro Abs: 4.5 10*3/uL (ref 1.7–7.7)
Neutrophils Relative %: 60 %
Platelets: 335 10*3/uL (ref 150–400)
RBC: 5.54 MIL/uL (ref 4.22–5.81)
RDW: 14.3 % (ref 11.5–15.5)
WBC: 7.5 10*3/uL (ref 4.0–10.5)
nRBC: 0 % (ref 0.0–0.2)

## 2023-01-18 LAB — COMPREHENSIVE METABOLIC PANEL
ALT: 22 U/L (ref 0–44)
AST: 30 U/L (ref 15–41)
Albumin: 3.4 g/dL — ABNORMAL LOW (ref 3.5–5.0)
Alkaline Phosphatase: 64 U/L (ref 38–126)
Anion gap: 11 (ref 5–15)
BUN: 10 mg/dL (ref 6–20)
CO2: 29 mmol/L (ref 22–32)
Calcium: 9.1 mg/dL (ref 8.9–10.3)
Chloride: 100 mmol/L (ref 98–111)
Creatinine, Ser: 1.11 mg/dL (ref 0.61–1.24)
GFR, Estimated: >60 mL/min (ref >60)
Glucose, Bld: 85 mg/dL (ref 70–99)
Potassium: 4 mmol/L (ref 3.5–5.1)
Sodium: 140 mmol/L (ref 135–145)
Total Bilirubin: 0.5 mg/dL (ref 0.3–1.2)
Total Protein: 5.7 g/dL — ABNORMAL LOW (ref 6.5–8.1)

## 2023-01-18 LAB — POCT URINE DRUG SCREEN - MANUAL ENTRY (I-SCREEN)
POC Amphetamine UR: NOT DETECTED
POC Buprenorphine (BUP): NOT DETECTED
POC Cocaine UR: POSITIVE — AB
POC Marijuana UR: POSITIVE — AB
POC Methadone UR: NOT DETECTED
POC Methamphetamine UR: NOT DETECTED
POC Morphine: NOT DETECTED
POC Oxazepam (BZO): NOT DETECTED
POC Oxycodone UR: NOT DETECTED
POC Secobarbital (BAR): NOT DETECTED

## 2023-01-18 LAB — LIPID PANEL
Cholesterol: 144 mg/dL (ref 0–200)
HDL: 63 mg/dL (ref >40)
LDL Cholesterol: 64 mg/dL (ref 0–99)
Total CHOL/HDL Ratio: 2.3 RATIO
Triglycerides: 84 mg/dL (ref <150)
VLDL: 17 mg/dL (ref 0–40)

## 2023-01-18 LAB — TSH: TSH: 0.869 u[IU]/mL (ref 0.350–4.500)

## 2023-01-18 LAB — ETHANOL: Alcohol, Ethyl (B): <10 mg/dL (ref <10)

## 2023-01-18 LAB — HEMOGLOBIN A1C
Hgb A1c MFr Bld: 5.5 % (ref 4.8–5.6)
Mean Plasma Glucose: 111.15 mg/dL

## 2023-01-18 MED ORDER — LOPERAMIDE HCL 2 MG PO CAPS: 2.0000 mg | ORAL_CAPSULE | ORAL | Status: DC | PRN

## 2023-01-18 MED ORDER — ADULT MULTIVITAMIN W/MINERALS CH
1.0000 | ORAL_TABLET | Freq: Every day | ORAL | Status: DC
  Administered 2023-01-18 – 2023-01-21 (×4): 1 via ORAL
  Filled 2023-01-18 (×4): qty 1

## 2023-01-18 MED ORDER — LORAZEPAM 1 MG PO TABS: 1.0000 mg | ORAL_TABLET | ORAL | Status: DC | PRN

## 2023-01-18 MED ORDER — THIAMINE HCL 100 MG/ML IJ SOLN
100.0000 mg | Freq: Once | INTRAMUSCULAR | Status: AC
  Administered 2023-01-18: 100 mg via INTRAMUSCULAR
  Filled 2023-01-18: qty 2

## 2023-01-18 MED ORDER — THIAMINE MONONITRATE 100 MG PO TABS
100.0000 mg | ORAL_TABLET | Freq: Every day | ORAL | Status: DC
  Administered 2023-01-19 – 2023-01-21 (×3): 100 mg via ORAL
  Filled 2023-01-18 (×3): qty 1

## 2023-01-18 MED ORDER — OLANZAPINE 10 MG PO TBDP: 10.0000 mg | ORAL_TABLET | Freq: Three times a day (TID) | ORAL | Status: DC | PRN

## 2023-01-18 MED ORDER — ALUM & MAG HYDROXIDE-SIMETH 200-200-20 MG/5ML PO SUSP: 30.0000 mL | ORAL | Status: DC | PRN

## 2023-01-18 MED ORDER — ZIPRASIDONE MESYLATE 20 MG IM SOLR: 20.0000 mg | INTRAMUSCULAR | Status: DC | PRN

## 2023-01-18 MED ORDER — MAGNESIUM HYDROXIDE 400 MG/5ML PO SUSP: 30.0000 mL | Freq: Every day | ORAL | Status: DC | PRN

## 2023-01-18 MED ORDER — LORAZEPAM 1 MG PO TABS: 1.0000 mg | ORAL_TABLET | Freq: Four times a day (QID) | ORAL | Status: DC | PRN

## 2023-01-18 MED ORDER — HYDROXYZINE HCL 25 MG PO TABS
25.0000 mg | ORAL_TABLET | Freq: Four times a day (QID) | ORAL | Status: DC | PRN
  Filled 2023-01-18: qty 1

## 2023-01-18 MED ORDER — ONDANSETRON 4 MG PO TBDP: 4.0000 mg | ORAL_TABLET | Freq: Four times a day (QID) | ORAL | Status: DC | PRN

## 2023-01-18 MED ORDER — ACETAMINOPHEN 325 MG PO TABS: 650.0000 mg | ORAL_TABLET | Freq: Four times a day (QID) | ORAL | Status: DC | PRN

## 2023-01-18 NOTE — ED Notes (Signed)
Pt admitted to [facility] endorsing  ETOH abuse and substance abuse and seeking for treatment. Patient was cooperative during the admission assessment. Skin assessment complete. Belongings inventoried. Patient oriented to unit and unit rules. Meal and drinks offered to patient.  Patient verbalized agreement to treatment plans. Patient verbally contracts for safety while hospitalized. Will monitor for safety.

## 2023-01-18 NOTE — ED Provider Notes (Signed)
Facility Based Crisis Admission H&P  Date: 01/19/23 Patient Name: Lance Walton MRN: [id number] Chief Complaint: need detox to get into [facility] program   Diagnoses:  Final diagnoses:  Alcohol abuse  Cocaine abuse [facility])  Marijuana abuse    HPI: Lance Walton, 42 y/o male with a history of alcohol abuse, cocaine use disorder, presented to [facility] voluntarily.  The patient he is looking detox from getting to a program at [facility] according to the patient he consume (2) 40 ounce beer daily, use marijuana at least 4 times a week and cocaine 4 times a week.  According to patient he was at [facility] year in their rehab program but only did 26 days and he left after.  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   Copied from triage notes: Lance Walton : Urgent : Pt is a 42 yo male who presents to [facility] voluntarily seeking detox treatment. Pt has been regularly using alcohol, marijuana and cocaine. Pt reports that he uses alcohol daily (2) (40 oz beers) and use of cannabis/cocaine 3-4 times a week. Pt reports that he uses approximately 3 grams of marijuana weekly and $200 of cocaine weekly. Pt stated his last use of all substances was yesterday. Pt denied any hx of seizures from alcohol withdrawal. Pt denied SI, hx of suicide attempts, HI, NSSH, AVH and paranoia. Pt denied any hx of IP psychiatric hospitalizations. Pt did report that he spent 27 days at [facility] in September 2023. Pt reports that after that program he remained sober for 60 days. Pt denied any mental health diagnoses. Pt denies any legal concerns at this time. Pt denies having a therapist or psychiatrist at this time  Face-to-face observation of patient, patient is alert and oriented x 4, speech is clear, maintaining eye contact.  Patient denies SI, HI, AVH or paranoia at this time.  Patient reports alcohol consumption on a daily basis, report marijuana  and cocaine use at least 4 times a week.  Patient denies any other illicit drug use at this time patient denies any access to guns.  According to patient he is currently living outside of a hotel.  Patient denies any prior psychiatric diagnosis apart from alcohol abuse and polysubstance use. Discuss with patient FBC compliance.  Pt in agreement with plan of care  PHQ-9 was completed by writer,  patient scored a 9 which correlates to be mild depression.  Recommend FBC  PHQ 2-9:  Flowsheet Row ED from 05/25/2022 in [facility] ED from 11/13/2021 in [facility]  Thoughts that you would be better off dead, or of hurting yourself in some way Not at all Not at all  PHQ-9 Total Score 0 9       Flowsheet Row ED from 01/18/2023 in [facility] ED from 05/25/2022 in [facility] ED from 02/04/2022 in [facility] Emergency Department at [facility]  C-SSRS RISK CATEGORY No Risk No Risk No Risk         Total Time spent with patient: 30 minutes  Musculoskeletal  Strength & Muscle Tone: within normal limits Gait & Station: normal Patient leans: N/A  Psychiatric Specialty Exam  Presentation General Appearance:  Casual  Eye Contact: Good  Speech: Clear and Coherent  Speech Volume: Normal  Handedness: Right   Mood and Affect  Mood: Anxious  Affect: Appropriate  Thought Process  Thought Processes: Coherent  Descriptions of Associations:Circumstantial  Orientation:Full (Time, Place and Person)  Thought Content:Logical  Diagnosis of Schizophrenia or Schizoaffective disorder in past: No   Hallucinations:Hallucinations: None  Ideas of Reference:None  Suicidal Thoughts:Suicidal Thoughts: No  Homicidal Thoughts:Homicidal Thoughts: No   Sensorium  Memory: Immediate Good  Judgment: Fair  Insight: Fair   Art therapist   Concentration: Good  Attention Span: Good  Recall: Good  Fund of Knowledge: Good  Language: Good   Psychomotor Activity  Psychomotor Activity: Psychomotor Activity: Normal   Assets  Assets: Desire for Improvement; Housing   Sleep  Sleep: Sleep: Fair Number of Hours of Sleep: 6   Nutritional Assessment (For OBS and FBC admissions only) Has the patient had a weight loss or gain of 10 pounds or more in the last 3 months?: No Has the patient had a decrease in food intake/or appetite?: No Does the patient have dental problems?: No Does the patient have eating habits or behaviors that may be indicators of an eating disorder including binging or inducing vomiting?: No Has the patient recently lost weight without trying?: 0 Has the patient been eating poorly because of a decreased appetite?: 0 Malnutrition Screening Tool Score: 0    Physical Exam HENT:     Head: Normocephalic.     Nose: Nose normal.  Cardiovascular:     Rate and Rhythm: Normal rate.  Pulmonary:     Effort: Pulmonary effort is normal.  Musculoskeletal:        General: Normal range of motion.     Cervical back: Normal range of motion.  Skin:    General: Skin is warm.  Neurological:     General: No focal deficit present.     Mental Status: He is alert.  Psychiatric:        Mood and Affect: Mood normal.        Thought Content: Thought content normal.        Judgment: Judgment normal.    Review of Systems  Constitutional: Negative.   HENT: Negative.    Eyes: Negative.   Respiratory: Negative.    Cardiovascular: Negative.   Genitourinary: Negative.   Musculoskeletal: Negative.   Skin: Negative.   Neurological: Negative.   Psychiatric/Behavioral:  Positive for substance abuse. The patient is nervous/anxious.     Blood pressure 105/76, pulse 82, temperature 98.3 F (36.8 C), temperature source Oral, resp. rate 18, SpO2 100 %. There is no height or weight on file to calculate  BMI.  Past Psychiatric History: alcohol abuse, substance abuse   Is the patient at risk to self? No  Has the patient been a risk to self in the past 6 months? No .    Has the patient been a risk to self within the distant past? No   Is the patient a risk to others? No   Has the patient been a risk to others in the past 6 months? No   Has the patient been a risk to others within the distant past? No   Past Medical History: see chart Family History: unknown Social History: cocaine, alcohol  Last Labs:  Admission on 01/18/2023  Component Date Value Ref Range Status   WBC 01/18/2023 7.5  4.0 - 10.5 K/uL Final   RBC 01/18/2023 5.54  4.22 - 5.81 MIL/uL Final   Hemoglobin 01/18/2023 15.3  13.0 - 17.0 g/dL Final   HCT 11/91/4782 47.1  39.0 - 52.0 % Final   MCV 01/18/2023 85.0  80.0 - 100.0 fL Final   MCH 01/18/2023 27.6  26.0 - 34.0 pg Final   MCHC 01/18/2023 32.5  30.0 - 36.0 g/dL Final   RDW 40/98/1191 14.3  11.5 - 15.5 % Final   Platelets 01/18/2023 335  150 - 400 K/uL Final   nRBC 01/18/2023 0.0  0.0 - 0.2 % Final   Neutrophils Relative % 01/18/2023 60  % Final   Neutro Abs 01/18/2023 4.5  1.7 - 7.7 K/uL Final   Lymphocytes Relative 01/18/2023 32  % Final   Lymphs Abs 01/18/2023 2.4  0.7 - 4.0 K/uL Final   Monocytes Relative 01/18/2023 7  % Final   Monocytes Absolute 01/18/2023 0.6  0.1 - 1.0 K/uL Final   Eosinophils Relative 01/18/2023 1  % Final   Eosinophils Absolute 01/18/2023 0.0  0.0 - 0.5 K/uL Final   Basophils Relative 01/18/2023 0  % Final   Basophils Absolute 01/18/2023 0.0  0.0 - 0.1 K/uL Final   Immature Granulocytes 01/18/2023 0  % Final   Abs Immature Granulocytes 01/18/2023 0.03  0.00 - 0.07 K/uL Final   Performed at [facility], [address] [place], [place] [address]   Sodium 01/18/2023 140  135 - 145 mmol/L Final   Potassium 01/18/2023 4.0  3.5 - 5.1 mmol/L Final   Chloride 01/18/2023 100  98 - 111 mmol/L Final   CO2 01/18/2023 29  22 - 32 mmol/L  Final   Glucose, Bld 01/18/2023 85  70 - 99 mg/dL Final   Glucose reference range applies only to samples taken after fasting for at least 8 hours.   BUN 01/18/2023 10  6 - 20 mg/dL Final   Creatinine, Ser 01/18/2023 1.11  0.61 - 1.24 mg/dL Final   Calcium 56/21/3086 9.1  8.9 - 10.3 mg/dL Final   Total Protein 57/84/6962 5.7 (L)  6.5 - 8.1 g/dL Final   Albumin 95/28/4132 3.4 (L)  3.5 - 5.0 g/dL Final   AST 44/08/270 30  15 - 41 U/L Final   ALT 01/18/2023 22  0 - 44 U/L Final   Alkaline Phosphatase 01/18/2023 64  38 - 126 U/L Final   Total Bilirubin 01/18/2023 0.5  0.3 - 1.2 mg/dL Final   GFR, Estimated 01/18/2023 >60  >60 mL/min Final   Comment: (NOTE) Calculated using the CKD-EPI Creatinine Equation (2021)    Anion gap 01/18/2023 11  5 - 15 Final   Performed at [facility], [address], [place], [place] [address]   Hgb A1c MFr Bld 01/18/2023 5.5  4.8 - 5.6 % Final   Comment: (NOTE) Pre diabetes:          5.7%-6.4%  Diabetes:              >6.4%  Glycemic control for   <7.0% adults with diabetes    Mean Plasma Glucose 01/18/2023 111.15  mg/dL Final   Performed at [facility], [address], [place], [place] [address]   Alcohol, Ethyl (B) 01/18/2023 <10  <10 mg/dL Final   Comment: (NOTE) Lowest detectable limit for serum alcohol is 10 mg/dL.  For medical purposes only. Performed at [facility], [address] [place], [place] [address]    Cholesterol 01/18/2023 144  0 - 200 mg/dL Final   Triglycerides 63/87/5643 84  <150 mg/dL Final   HDL 32/95/1884 63  >40 mg/dL Final   Total CHOL/HDL Ratio 01/18/2023 2.3  RATIO Final   VLDL 01/18/2023 17  0 - 40 mg/dL Final   LDL Cholesterol 01/18/2023 64  0 - 99 mg/dL Final   Comment:        Total Cholesterol/HDL:CHD Risk Coronary Heart Disease Risk Table                     Men   Women  1/2 Average Risk   3.4   3.3  Average Risk       5.0   4.4  2 X Average Risk   9.6   7.1  3 X Average Risk  23.4    11.0        Use the calculated Patient Ratio above and the CHD Risk Table to determine the patient's CHD Risk.        ATP III CLASSIFICATION (LDL):  <100     mg/dL   Optimal  161-096  mg/dL   Near or Above                    Optimal  130-159  mg/dL   Borderline  045-409  mg/dL   High  >811     mg/dL   Very High Performed at [facility], [address] [place], [place] [address]    TSH 01/18/2023 0.869  0.350 - 4.500 uIU/mL Final   Comment: Performed by a 3rd Generation assay with a functional sensitivity of <=0.01 uIU/mL. Performed at [facility], [address], [place], [place] [address]    POC Amphetamine UR 01/18/2023 None Detected  NONE DETECTED (Cut Off Level 1000 ng/mL) Final   POC Secobarbital (BAR) 01/18/2023 None Detected  NONE DETECTED (Cut Off Level 300 ng/mL) Final   POC Buprenorphine (BUP) 01/18/2023 None Detected  NONE DETECTED (Cut Off Level 10 ng/mL) Final   POC Oxazepam (BZO) 01/18/2023 None Detected  NONE DETECTED (Cut Off Level 300 ng/mL) Final   POC Cocaine UR 01/18/2023 Positive (A)  NONE DETECTED (Cut Off Level 300 ng/mL) Final   POC Methamphetamine UR 01/18/2023 None Detected  NONE DETECTED (Cut Off Level 1000 ng/mL) Final   POC Morphine 01/18/2023 None Detected  NONE DETECTED (Cut Off Level 300 ng/mL) Final   POC Methadone UR 01/18/2023 None Detected  NONE DETECTED (Cut Off Level 300 ng/mL) Final   POC Oxycodone UR 01/18/2023 None Detected  NONE DETECTED (Cut Off Level 100 ng/mL) Final   POC Marijuana UR 01/18/2023 Positive (A)  NONE DETECTED (Cut Off Level 50 ng/mL) Final    Allergies: Patient has no known allergies.  Medications:  Facility Ordered Medications  Medication   acetaminophen (TYLENOL) tablet 650 mg   alum & mag hydroxide-simeth (MAALOX/MYLANTA) 200-200-20 MG/5ML suspension 30 mL   magnesium hydroxide (MILK OF MAGNESIA) suspension 30 mL   [COMPLETED] thiamine (VITAMIN B1) injection 100 mg   thiamine (VITAMIN B1) tablet 100  mg   multivitamin with minerals tablet 1 tablet   LORazepam (ATIVAN) tablet 1 mg   hydrOXYzine (ATARAX) tablet 25 mg   loperamide (IMODIUM) capsule 2-4 mg   ondansetron (ZOFRAN-ODT) disintegrating tablet 4 mg   OLANZapine zydis (ZYPREXA) disintegrating tablet 10 mg   And   LORazepam (ATIVAN) tablet 1 mg   And   ziprasidone (GEODON) injection 20 mg    Long Term Goals: Improvement in symptoms so as ready for discharge  Short Term Goals: Patient will verbalize feelings in meetings with treatment team members., Patient will attend at least of 50% of the groups daily., Pt will complete the  PHQ9 on admission, day 3 and discharge., Patient will participate in completing the [place] Suicide Severity Rating Scale, Patient will score a low risk of violence for 24 hours prior to discharge, and Patient will take medications as prescribed daily.  Medical Decision Making  Inpatient FBC    Lab Orders         CBC with Differential/Platelet         Comprehensive metabolic panel         Hemoglobin A1c         Ethanol         Lipid panel         TSH         POCT Urine Drug Screen - (I-Screen)      Meds ordered this encounter  Medications   acetaminophen (TYLENOL) tablet 650 mg   alum & mag hydroxide-simeth (MAALOX/MYLANTA) 200-200-20 MG/5ML suspension 30 mL   magnesium hydroxide (MILK OF MAGNESIA) suspension 30 mL   thiamine (VITAMIN B1) injection 100 mg   thiamine (VITAMIN B1) tablet 100 mg   multivitamin with minerals tablet 1 tablet   LORazepam (ATIVAN) tablet 1 mg   hydrOXYzine (ATARAX) tablet 25 mg   loperamide (IMODIUM) capsule 2-4 mg   ondansetron (ZOFRAN-ODT) disintegrating tablet 4 mg   AND Linked Order Group    OLANZapine zydis (ZYPREXA) disintegrating tablet 10 mg    LORazepam (ATIVAN) tablet 1 mg    ziprasidone (GEODON) injection 20 mg     Recommendations  Based on my evaluation the patient appears to have an emergency medical condition for which I recommend the patient be  transferred to the emergency department for further evaluation.  Sindy Guadeloupe, NP 01/19/23  5:42 AM
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
## 2023-01-19 ENCOUNTER — Encounter (HOSPITAL_COMMUNITY): Payer: Self-pay

## 2023-01-19 DIAGNOSIS — F1010 Alcohol abuse, uncomplicated: Secondary | ICD-10-CM | POA: Diagnosis not present

## 2023-01-19 DIAGNOSIS — F1410 Cocaine abuse, uncomplicated: Secondary | ICD-10-CM | POA: Diagnosis not present

## 2023-01-19 DIAGNOSIS — F1210 Cannabis abuse, uncomplicated: Secondary | ICD-10-CM | POA: Diagnosis not present

## 2023-01-19 NOTE — ED Notes (Signed)
Pt is in the bed sleeping. Respirations are even and unlabored. No acute distress noted. Will continue to monitor for safety. 

## 2023-01-19 NOTE — ED Notes (Signed)
Pt observed/assessed in room sleeping. RR even and unlabored, appearing in no noted distress. Environmental check complete, will continue to monitor for safety 

## 2023-01-19 NOTE — BH IP Treatment Plan (Signed)
Interdisciplinary Treatment and Diagnostic Plan Update  01/19/2023 Time of Session: 10:30AM Lance Walton MRN: [id number]  Diagnosis:  Final diagnoses:  Alcohol abuse  Cocaine abuse (HCC)  Marijuana abuse     Current Medications:  Current Facility-Administered Medications  Medication Dose Route Frequency Provider Last Rate Last Admin   acetaminophen (TYLENOL) tablet 650 mg  650 mg Oral Q6H PRN Sindy Guadeloupe, NP       alum & mag hydroxide-simeth (MAALOX/MYLANTA) 200-200-20 MG/5ML suspension 30 mL  30 mL Oral Q4H PRN Sindy Guadeloupe, NP       hydrOXYzine (ATARAX) tablet 25 mg  25 mg Oral Q6H PRN Sindy Guadeloupe, NP       loperamide (IMODIUM) capsule 2-4 mg  2-4 mg Oral PRN Sindy Guadeloupe, NP       LORazepam (ATIVAN) tablet 1 mg  1 mg Oral Q6H PRN Sindy Guadeloupe, NP       OLANZapine zydis (ZYPREXA) disintegrating tablet 10 mg  10 mg Oral Q8H PRN Sindy Guadeloupe, NP       And   LORazepam (ATIVAN) tablet 1 mg  1 mg Oral PRN Sindy Guadeloupe, NP       And   ziprasidone (GEODON) injection 20 mg  20 mg Intramuscular PRN Sindy Guadeloupe, NP       magnesium hydroxide (MILK OF MAGNESIA) suspension 30 mL  30 mL Oral Daily PRN Sindy Guadeloupe, NP       multivitamin with minerals tablet 1 tablet  1 tablet Oral Daily Sindy Guadeloupe, NP   1 tablet at 01/19/23 1008   ondansetron (ZOFRAN-ODT) disintegrating tablet 4 mg  4 mg Oral Q6H PRN Sindy Guadeloupe, NP       thiamine (VITAMIN B1) tablet 100 mg  100 mg Oral Daily Sindy Guadeloupe, NP   100 mg at 01/19/23 1008   No current outpatient medications on file.   PTA Medications: Prior to Admission medications   Not on File    Patient Stressors: Financial difficulties   Legal issue   Loss of transportation due to Driving while licensed revoked   Substance abuse    Patient Strengths: Ability for insight  Average or above average intelligence  Capable of independent living  Forensic psychologist fund of knowledge   Treatment Modalities: Medication  Management, Group therapy, Case management,  1 to 1 session with clinician, Psychoeducation, Recreational therapy.   Physician Treatment Plan for Primary and Secondary Diagnosis:  Final diagnoses:  Alcohol abuse  Cocaine abuse (HCC)  Marijuana abuse   Long Term Goal(s): Improvement in symptoms so as ready for discharge  Short Term Goals: Patient will verbalize feelings in meetings with treatment team members. Patient will attend at least of 50% of the groups daily. Pt will complete the PHQ9 on admission, day 3 and discharge. Patient will participate in completing the [place] Suicide Severity Rating Scale Patient will score a low risk of violence for 24 hours prior to discharge Patient will take medications as prescribed daily.  Medication Management: Evaluate patient's response, side effects, and tolerance of medication regimen.  Therapeutic Interventions: 1 to 1 sessions, Unit Group sessions and Medication administration.  Evaluation of Outcomes: Progressing  LCSW Treatment Plan for Primary Diagnosis:  Final diagnoses:  Alcohol abuse  Cocaine abuse (HCC)  Marijuana abuse    Long Term Goal(s): Safe transition to appropriate next level of care at discharge.  Short Term Goals: Facilitate acceptance of mental health diagnosis and concerns through verbal commitment to aftercare plan and appointments at discharge., Patient  will identify one social support prior to discharge to aid in patient's recovery., Patient will attend AA/NA groups as scheduled., Identify minimum of 2 triggers associated with mental health/substance abuse issues with treatment team members., and Increase skills for wellness and recovery by attending 50% of scheduled groups.  Therapeutic Interventions: Assess for all discharge needs, 1 to 1 time with Child psychotherapist, Explore available resources and support systems, Assess for adequacy in community support network, Educate family and significant other(s) on suicide  prevention, Complete Psychosocial Assessment, Interpersonal group therapy.  Evaluation of Outcomes: Progressing   Progress in Treatment: Attending groups: Yes. Participating in groups: Yes. Taking medication as prescribed: Yes. Toleration medication: Yes. Family/Significant other contact made: No, will contact:  Patient declined collateral at this time.  Patient understands diagnosis: Yes. Discussing patient identified problems/goals with staff: Yes. Medical problems stabilized or resolved: Yes. Denies suicidal/homicidal ideation: Yes. Issues/concerns per patient self-inventory: Yes. Other: need for long term treatment to help with his substance use   New problem(s) identified: No, Describe:  other than reported on admission  New Short Term/Long Term Goal(s): Safe transition to appropriate next level of care at discharge, Engage patient in therapeutic group addressing interpersonal concerns. Engage patient in aftercare planning with referrals and resources, Increase ability to appropriately verbalize feelings, Facilitate acceptance of mental health diagnosis and concerns and Identify triggers associated with mental health/substance abuse issues.   Patient Goals: Patient is seeking residential placement at this time for substance use and is hopeful to secure placement within the next 2-3 days.    Discharge Plan or Barriers: LCSW will send referrals out for review for residential placement. Updates will be provided as received.   Reason for Continuation of Hospitalization: Withdrawal symptoms  Estimated Length of Stay: 3-5 days  Last 3 [place] Suicide Severity Risk Score: Flowsheet Row ED from 01/18/2023 in [facility] ED from 05/25/2022 in [facility] ED from 02/04/2022 in [facility] Emergency Department at [facility]  C-SSRS RISK CATEGORY No Risk No Risk No Risk       Last PHQ 2/9 Scores:    01/18/2023    9:01 PM  05/27/2022    1:17 PM 05/26/2022    1:17 PM  Depression screen PHQ 2/9  Decreased Interest 0 0 0  Down, Depressed, Hopeless 1 0 0  PHQ - 2 Score 1 0 0  Altered sleeping  0   Tired, decreased energy  0   Change in appetite  0   Feeling bad or failure about yourself   0   Trouble concentrating  0   Moving slowly or fidgety/restless  0   Suicidal thoughts  0   PHQ-9 Score  0   Difficult doing work/chores  Not difficult at all     Scribe for Treatment Team: Loleta Dicker, LCSWA 01/19/2023 11:19 AM
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
## 2023-01-19 NOTE — ED Provider Notes (Signed)
FBC Progress Note  Date and Time: 01/19/2023 11:24 AM Name: Lance Walton MRN:  [id number]  Reason For Admission: Alcohol, Cocaine, Cannabis abuse  Subjective:   Patient seen and assessed at bedside.  Patient denies any acute complaints at this time.  Denies SI/HI/AVH.  Patient reports that he has a history of cocaine use, alcohol use, and THC use.  He is motivated to quit at this time and would like to participate at [facility] residential treatment upon completion of substance detox.  He denies any acute withdrawal symptoms at this time.  He denies any significant withdrawal symptoms that required medical hospitalization.  He is eating and sleeping well at this time.  All questions addressed at this time.  Will continue to monitor him for any withdrawal symptoms from alcohol.  Diagnosis:  Final diagnoses:  Alcohol abuse  Cocaine abuse (HCC)  Marijuana abuse    Total Time spent with patient: 45 minutes   Labs  Lab Results:     Latest Ref Rng & Units 01/18/2023    9:00 PM 05/25/2022    4:59 PM 11/13/2021    8:22 PM  CBC  WBC 4.0 - 10.5 K/uL 7.5  8.2  13.5   Hemoglobin 13.0 - 17.0 g/dL 47.8  29.5  62.1   Hematocrit 39.0 - 52.0 % 47.1  47.3  47.9   Platelets 150 - 400 K/uL 335  251  267       Latest Ref Rng & Units 01/18/2023    9:00 PM 05/25/2022    4:59 PM 11/13/2021    8:22 PM  CMP  Glucose 70 - 99 mg/dL 85  96  308   BUN 6 - 20 mg/dL 10  <5  7   Creatinine 0.61 - 1.24 mg/dL 6.57  8.46  9.62   Sodium 135 - 145 mmol/L 140  138  136   Potassium 3.5 - 5.1 mmol/L 4.0  3.9  3.9   Chloride 98 - 111 mmol/L 100  100  99   CO2 22 - 32 mmol/L 29  31  25    Calcium 8.9 - 10.3 mg/dL 9.1  9.0  9.0   Total Protein 6.5 - 8.1 g/dL 5.7  5.9  5.9   Total Bilirubin 0.3 - 1.2 mg/dL 0.5  0.7  0.5   Alkaline Phos 38 - 126 U/L 64  59  52   AST 15 - 41 U/L 30  20  22    ALT 0 - 44 U/L 22  14  16      Physical Findings   PHQ2-9    Flowsheet Row ED from 01/18/2023 in [facility] ED from 05/25/2022 in [facility] ED from 11/13/2021 in [facility]  PHQ-2 Total Score 1 0 1  PHQ-9 Total Score -- 0 9      Flowsheet Row ED from 01/18/2023 in [facility] ED from 05/25/2022 in [facility] ED from 02/04/2022 in [facility] at [facility]  C-SSRS RISK CATEGORY No Risk No Risk No Risk        Musculoskeletal  Strength & Muscle Tone: within normal limits Gait & Station: normal Patient leans: N/A  Psychiatric Specialty Exam  Presentation  General Appearance:  Casual   Eye Contact: Good   Speech: Clear and Coherent   Speech Volume: Normal   Handedness: Right    Mood and Affect  Mood: Anxious   Affect:  Appropriate    Thought Process  Thought Processes: Coherent   Descriptions of Associations:Circumstantial   Orientation:Full (Time, Place and Person)   Thought Content:Logical   Diagnosis of Schizophrenia or Schizoaffective disorder in past: No     Hallucinations:Hallucinations: None   Ideas of Reference:None   Suicidal Thoughts:Suicidal Thoughts: No   Homicidal Thoughts:Homicidal Thoughts: No    Sensorium  Memory: Immediate Good   Judgment: Fair   Insight: Fair    Art therapist  Concentration: Good   Attention Span: Good   Recall: Good   Fund of Knowledge: Good   Language: Good    Psychomotor Activity  Psychomotor Activity: Psychomotor Activity: Normal    Assets  Assets: Desire for Improvement; Housing    Sleep  Sleep: Sleep: Fair Number of Hours of Sleep: 6    Physical Exam  Physical Exam ROS Blood pressure 105/69, pulse 73, temperature 99 F (37.2 C), temperature source Tympanic, resp. rate 16, SpO2 99 %. There is no height or weight on file to calculate BMI.  ASSESSMENT Lance Walton is a 42 y.o. male with no  Significant past psychiatric history [facility] from alcohol, cannabis, and crack cocaine.  It appears that he is currently motivated to discontinue all substance use including alcohol.  He reports moderate success with attending [facility] residential treatment last year so he would like to pursue this again in order to reduce his risk of relapse.  PLAN Stimulant use disorder-cocaine Alcohol use disorder Cannabis use disorder -Continue multivitamin/thiamine -CIWA score with Ativan as needed (last score this AM was 0)  Dispo [facility]   Park Pope, MD 01/19/2023 11:24 AM
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
## 2023-01-19 NOTE — Tx Team (Signed)
LCSW met with patient to assess current mood, affect, physical state, and inquire about needs/goals while here in [facility] and after discharge. Patient reports he presented due to needing to seek residential placement for his substance use. Patient reports he lives in a hotel alone and reports having support from his mother. Patient denies having access to transportation due driving while license revoked. Patient reports he is facing multiple traffic violations due to this. Patient reports he has been struggling with cocaine for about 15 years, alcohol since the age of 70, and marijuana. Patient reports he uses about $200 worth of cocaine per week. Patient reports her drinks on average two 40 oz beers per day. Patient reports his marijuana use fluctuates as he can uses about 3 grams over the course of 2-3 days. Patient reports he has been working at [employer] and [employer] to make ends meet. Patient reports his current goal is to seek residential placement for substance use. Patient denies any prior history of outpatient or inpatient substance abuse treatment. Patient currently denies any SI/HI/AVH and reports mood as good. Patient aware that LCSW will send referrals out for review and will follow up to provide updates as received. Patient expressed understanding and appreciation of LCSW assistance. No other needs were reported at this time by patient.   Referral will be sent to [facility] and [facility] for review. LCSW will continue to follow and provide support to patient while on [facility] unit.   Fernande Boyden, LCSW Clinical Social Worker [place] [facility] Ph: [phone]

## 2023-01-19 NOTE — ED Notes (Signed)
Pt resting in room. A/O, denies SI/HI/AVH. Denies c/o withdrawal symptoms. No noted distress. Will continue to monitor for safety

## 2023-01-19 NOTE — ED Notes (Signed)
Received patient this AM. Patient in his bed sleeping. Patient respirations are even and unlabored. Will continue to monitor for safety. 

## 2023-01-19 NOTE — ED Notes (Signed)
Patient denies SI,HI,AVH. Morning medication administered without difficulty to patient. Patient participating in groups. Patient is cooperative and interacts well with staff. Patient shows no signs of ETOH withdrawal and opioid withdrawal.  Respiratory is even and unlabored. No distress noted. Patient sleeping or resting in bed at present. Will continue to monitor for safety.

## 2023-01-19 NOTE — ED Notes (Signed)
Pt did not attend group. 

## 2023-01-20 DIAGNOSIS — F1010 Alcohol abuse, uncomplicated: Secondary | ICD-10-CM | POA: Diagnosis not present

## 2023-01-20 DIAGNOSIS — F1410 Cocaine abuse, uncomplicated: Secondary | ICD-10-CM | POA: Diagnosis not present

## 2023-01-20 DIAGNOSIS — F1210 Cannabis abuse, uncomplicated: Secondary | ICD-10-CM | POA: Diagnosis not present

## 2023-01-20 NOTE — ED Notes (Signed)
Patient resting quietly in bed with eyes closed. Respirations equal and unlabored, skin warm and dry, NAD. No change in assessment or acuity. Q 15 minute safety checks remain in place.   

## 2023-01-20 NOTE — ED Notes (Signed)
Patient observed/assessed in dayroom watching TV. Patient alert and oriented x 4. Affect is flat.Patient denies pain and anxiety. He denies A/V/H. He denies having any thoughts/plan of self harm and harm towards others. Fluid and snack offered. Patient states that appetite has been good throughout the day. Verbalizes no further complaints at this time. Will continue to monitor and support.  

## 2023-01-20 NOTE — Discharge Instructions (Signed)
[facility] [address] [place], [place], [address] [phone] phone  New Patient Assessment/Therapy Walk-Ins:  Monday and Wednesday: 8 am until slots are full. Every 1st and 2nd Fridays of the month: 1 pm - 5 pm.  NO ASSESSMENT/THERAPY WALK-INS ON TUESDAYS OR THURSDAYS  New Patient Assessment/Medication Management Walk-Ins:  Monday - Friday:  8 am - 11 am.  For all walk-ins, we ask that you arrive by 7:30 am because patients will be seen in the order of arrival.  Availability is limited; therefore, you may not be seen on the same day that you walk-in.  Our goal is to serve and meet the needs of our community to the best of our ability.  SUBSTANCE USE TREATMENT for Medicaid and State Funded/IPRS  [facility] [address] [place], [place], [address] [phone] phone NOTE: [facility] is no longer offering IOP services.  Serves those who are low-income or have no insurance.  [facility] [address], [place], [place], [address] [phone] phone [phone] fax NOTE: Does have Substance Abuse-Intensive Outpatient Program (SAIOP) as well as transitional housing if eligible.  [facility] [address] [place], [place], [address] [phone] phone [phone] fax  [facility] [address] [place], [place], [address] [phone] phone [phone] fax  HALFWAY HOUSES:  [facility] [phone]  [facility] www.oxfordvacancies.com  12 STEP PROGRAMS:  Alcoholics Anonymous of [place] https://aagreensboronc.com/meeting  Narcotics Anonymous of [place] https://greensborona.org/meetings/  Al-Anon of [place], [place] www.greens[place]alanon.org/find-meetings.html  Nar-Anon https://nar-anon.org/find-a-meetin  List of Residential placements:   [facility] in [place]: [phone]  [facility]: [phone]  [facility]: [place], [place]  [phone]: Male and male facility; 30-day program: (uninsured and Medicaid such as [facility], [facility], [facility], partners)  [facility]: [phone]; men and women's facility; 28 days; Can have Medicaid tailored plan ([facility] or [facility])  [facility]: [phone] [place] or [place]; 28 day program; must be fully detox; tailored Medicaid or no insurance  [facility] in [place], [place]; [phone]; 28 day all males program; no insurance accepted  [facility] Referral in [place]: [place] [phone] (no insurance or Medicaid only); 90 days; outpatient services but provide housing in apartments [place]  [facility] Admission: [phone]: Patient must complete phone screening for placement: [place], [place]; 6 month program; uninsured, Medicaid, and [facility] insurance.   [facility]: no insurance required; [phone]  [place] [facility]: [phone]; Intake: Robert; Must fill out application online; Victor Delay [phone] x [phone]  [facility] in [place], [place]: [phone]; Admissions Coordinators Mr. Dennis or David Gibson; 90 day program.  [facility]: [place], [place] [phone]; Co-Ed 9 month to a year program; Online application; Men entry fee is $500 (6-12months);  [facility]: [address] , [place] [address]; no fee or insurance required; minimum of 2 years; Highly structured; work based; Intake Coordinator is Chris [phone]  [facility] in [place], : [phone]; Fax number is [phone]; website: www.[facility].org; Requires 3-6 page autobiography; 2 year program (18 months and then 6month transitional housing); Admission fee is $300; no insurance needed; work program  [facility] in [place], : Front Desk Staff: Reeci [phone]: They have a Men's Regenerations Program 6-9months. Free program; There is an initial $300 fee however, they are willing to work  with patients regarding that. Application is online.  [facility]: Admissions [phone] Benjamin Cox ext [phone]; Any 7-90 day program is out of pocket; 12   month program is free of charge; there is a $275 entry fee; Patient is responsible for own transportation 

## 2023-01-20 NOTE — Discharge Planning (Signed)
LCSW followed up with patient for updates regarding [facility] phone screening. Patient reports he was advised to call back on today regarding updates. LCSW followed up with [facility] regarding referral. Per Chanetta Marshall, patient is under review. Demographics and insurance looks appropriate. Awaiting update from admissions RN. Once update has been provided, agency will follow up around 12:00pm to provide an update. Patient informed of this information.    Fernande Boyden, LCSW Clinical Social Worker [place] [facility] Ph: [phone]
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
## 2023-01-20 NOTE — ED Notes (Signed)
Pt was given breakfast

## 2023-01-20 NOTE — Progress Notes (Signed)
Received Lance Walton this AM in the dining room eating breakfast.Later he was compliant with his medications. He remained visible in the milieu most of the day wa tching TV with his peers. He attended pet therapy this AM. He made several calls throughout the day. He denied all of the psychiatric symptoms.
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
## 2023-01-20 NOTE — ED Provider Notes (Signed)
FBC Progress Note  Date and Time: 01/20/2023 1:20 PM Name: Lance Walton MRN:  [id number]  Reason For Admission: Alcohol, Cocaine, Cannabis abuse  Subjective:   Patient seen and assessed at bedside.  Patient denies any acute complaints at this time. States he is in communication with [facility] today again to confirm he can be accepted there. No further questions at this time.   Diagnosis:  Final diagnoses:  Alcohol abuse  Cocaine abuse (HCC)  Marijuana abuse    Total Time spent with patient: 30 minutes   Labs  Lab Results:     Latest Ref Rng & Units 01/18/2023    9:00 PM 05/25/2022    4:59 PM 11/13/2021    8:22 PM  CBC  WBC 4.0 - 10.5 K/uL 7.5  8.2  13.5   Hemoglobin 13.0 - 17.0 g/dL 40.9  81.1  91.4   Hematocrit 39.0 - 52.0 % 47.1  47.3  47.9   Platelets 150 - 400 K/uL 335  251  267       Latest Ref Rng & Units 01/18/2023    9:00 PM 05/25/2022    4:59 PM 11/13/2021    8:22 PM  CMP  Glucose 70 - 99 mg/dL 85  96  782   BUN 6 - 20 mg/dL 10  <5  7   Creatinine 0.61 - 1.24 mg/dL 9.56  2.13  0.86   Sodium 135 - 145 mmol/L 140  138  136   Potassium 3.5 - 5.1 mmol/L 4.0  3.9  3.9   Chloride 98 - 111 mmol/L 100  100  99   CO2 22 - 32 mmol/L 29  31  25    Calcium 8.9 - 10.3 mg/dL 9.1  9.0  9.0   Total Protein 6.5 - 8.1 g/dL 5.7  5.9  5.9   Total Bilirubin 0.3 - 1.2 mg/dL 0.5  0.7  0.5   Alkaline Phos 38 - 126 U/L 64  59  52   AST 15 - 41 U/L 30  20  22    ALT 0 - 44 U/L 22  14  16      Physical Findings   PHQ2-9    Flowsheet Row ED from 01/18/2023 in [facility] ED from 05/25/2022 in [facility] ED from 11/13/2021 in [facility]  PHQ-2 Total Score 1 0 1  PHQ-9 Total Score -- 0 9      Flowsheet Row ED from 01/18/2023 in [facility] ED from 05/25/2022 in [facility] ED from 02/04/2022 in [facility] Emergency Department at [facility]  C-SSRS RISK CATEGORY No Risk No Risk No Risk        Musculoskeletal  Strength & Muscle Tone: within normal limits Gait & Station: normal Patient leans: N/A  Psychiatric Specialty Exam  Presentation  General Appearance:  Appropriate for Environment; Casual   Eye Contact: Good   Speech: Clear and Coherent; Normal Rate   Speech Volume: Normal   Handedness: Right    Mood and Affect  Mood: Euthymic   Affect: Appropriate; Congruent    Thought Process  Thought Processes: Coherent; Goal Directed; Linear   Descriptions of Associations:Intact   Orientation:Full (Time, Place and Person)   Thought Content:Logical   Diagnosis of Schizophrenia or Schizoaffective disorder in past: No     Hallucinations:Hallucinations: None    Ideas of Reference:None   Suicidal Thoughts:Suicidal Thoughts: No    Homicidal Thoughts:Homicidal  Thoughts: No     Sensorium  Memory: Remote Good   Judgment: Good   Insight: Good    Executive Functions  Concentration: Good   Attention Span: Good   Recall: Good   Fund of Knowledge: Good   Language: Good    Psychomotor Activity  Psychomotor Activity: Psychomotor Activity: Normal     Assets  Assets: Communication Skills; Resilience; Desire for Improvement    Sleep  Sleep: Sleep: Good     Physical Exam  Physical Exam Vitals and nursing note reviewed.  Constitutional:      General: He is not in acute distress.    Appearance: He is well-developed.  HENT:     Head: Normocephalic and atraumatic.  Eyes:     Conjunctiva/sclera: Conjunctivae normal.  Cardiovascular:     Rate and Rhythm: Normal rate and regular rhythm.     Heart sounds: No murmur heard. Pulmonary:     Effort: Pulmonary effort is normal. No respiratory distress.     Breath sounds: Normal breath sounds.  Abdominal:     Palpations: Abdomen is soft.     Tenderness: There is no abdominal tenderness.   Musculoskeletal:        General: No swelling.     Cervical back: Neck supple.  Skin:    General: Skin is warm and dry.     Capillary Refill: Capillary refill takes less than 2 seconds.  Neurological:     Mental Status: He is alert.  Psychiatric:        Mood and Affect: Mood normal.    Review of Systems  Respiratory:  Negative for shortness of breath.   Cardiovascular:  Negative for chest pain.  Gastrointestinal:  Negative for abdominal pain, constipation, diarrhea, heartburn, nausea and vomiting.  Neurological:  Negative for headaches.   Blood pressure 113/83, pulse 80, temperature 97.9 F (36.6 C), temperature source Tympanic, resp. rate 18, SpO2 100 %. There is no height or weight on file to calculate BMI.  ASSESSMENT Lance Walton is a 42 y.o. male with no Significant past psychiatric history [facility] from alcohol, cannabis, and crack cocaine.  PLAN Stimulant use disorder-cocaine Alcohol use disorder Cannabis use disorder -Continue multivitamin/thiamine -CIWA score with Ativan as needed (last score this AM was 0)  Dispo [facility]   Lance Pope, MD 01/20/2023 1:20 PM

## 2023-01-20 NOTE — ED Notes (Signed)
Pt was provided with dinner 

## 2023-01-20 NOTE — ED Notes (Signed)
Pt observed/assessed in room sleeping. RR even and unlabored, appearing in no noted distress. Environmental check complete, will continue to monitor for safety 

## 2023-01-21 DIAGNOSIS — F1410 Cocaine abuse, uncomplicated: Secondary | ICD-10-CM | POA: Diagnosis not present

## 2023-01-21 DIAGNOSIS — F1010 Alcohol abuse, uncomplicated: Secondary | ICD-10-CM | POA: Diagnosis not present

## 2023-01-21 DIAGNOSIS — F1210 Cannabis abuse, uncomplicated: Secondary | ICD-10-CM | POA: Diagnosis not present

## 2023-01-21 NOTE — ED Notes (Signed)
Patient resting quietly in bed with eyes closed. Respirations equal and unlabored, skin warm and dry, NAD. No change in assessment or acuity. Q 15 minute safety checks remain in place.   

## 2023-01-21 NOTE — ED Notes (Signed)
Patient is awake and alert on unit He made phone calls early and was mildly irritable and short with MD.  Patient realized he was being irritable and apologized.  He is in a better mood now after eating breakfast.  He is in dayroom watching tv, drinking coffee and socializing with peers.  Patient is without withdrawal and is focused on aftercare.  Will monitor and provide a safe environment.

## 2023-01-21 NOTE — Discharge Planning (Signed)
LCSW was informed by patient that he would like to discharge on today. Patient reports it is his daughter's 2th birthday on today and he would like to be home with her before he goes into treatment for himself. Patient reports his plan is to speak with his mother to see if she will allow him to stay with her in [place] until he can get into a residential program, [facility] or [facility]. Patient aware that he would have to remain sober for admission or he would have to follow up again for detox prior to admission. Patient expressed understanding and stated he has no desire to use again at this time. Patient aware that LCSW is still awaiting update from [facility] and [facility] regarding his referral. Patient did complete phone screening with [facility] on yesterday and reports he was told that he could follow up on Friday regarding bed availability for possible admission. Patient encouraged to continue to follow up. Patient aware that additional are in his AVS for his review.   MD and team made aware of request to discharge. Discharge prepared. No other needs to report.   Lance Boyden, LCSW Clinical Social Worker [facility] Ph: [phone]

## 2023-01-21 NOTE — ED Provider Notes (Signed)
FBC/OBS ASAP Discharge Summary  Date and Time: 01/21/2023 12:23 PM  Name: Lance Walton  MRN:  [id number]   Discharge Diagnoses:  Final diagnoses:  Alcohol abuse  Cocaine abuse (HCC)  Marijuana abuse   Stay Summary: Lance Walton is a 42 y.o. male with no Significant past psychiatric history [facility] from alcohol, cannabis, and crack cocaine.   Patient was admitted with plan to go to residential treatment after detoxing from alcohol and crack cocaine.  Patient was placed on CIWA which was 0 throughout his brief stay here.  He left early stating that he had to go to a niece's birthday party.  He had no acute withdrawal symptoms.  He was advised to stay so that he could go to residential treatment center directly, he insisted on discharging.  He did not meet inpatient criteria or IVC criteria.  Total Time spent with patient: 45 minutes  Past Psychiatric History: depression and anxiety Past Medical History: none Family History: unknown Family Psychiatric History: unknown Social History: $200 worth of crack cocaine weekly, 2 cans of beer daily, cannabis 3-4 days/week Tobacco Cessation:  N/A, patient does not currently use tobacco products  Current Medications:  Current Facility-Administered Medications  Medication Dose Route Frequency Provider Last Rate Last Admin   acetaminophen (TYLENOL) tablet 650 mg  650 mg Oral Q6H PRN Sindy Guadeloupe, NP       alum & mag hydroxide-simeth (MAALOX/MYLANTA) 200-200-20 MG/5ML suspension 30 mL  30 mL Oral Q4H PRN Sindy Guadeloupe, NP       hydrOXYzine (ATARAX) tablet 25 mg  25 mg Oral Q6H PRN Sindy Guadeloupe, NP       loperamide (IMODIUM) capsule 2-4 mg  2-4 mg Oral PRN Sindy Guadeloupe, NP       LORazepam (ATIVAN) tablet 1 mg  1 mg Oral Q6H PRN Sindy Guadeloupe, NP       OLANZapine zydis (ZYPREXA) disintegrating tablet 10 mg  10 mg Oral Q8H PRN Sindy Guadeloupe, NP       And   LORazepam (ATIVAN) tablet 1 mg  1 mg Oral PRN  Sindy Guadeloupe, NP       And   ziprasidone (GEODON) injection 20 mg  20 mg Intramuscular PRN Sindy Guadeloupe, NP       magnesium hydroxide (MILK OF MAGNESIA) suspension 30 mL  30 mL Oral Daily PRN Sindy Guadeloupe, NP       multivitamin with minerals tablet 1 tablet  1 tablet Oral Daily Sindy Guadeloupe, NP   1 tablet at 01/21/23 0921   ondansetron (ZOFRAN-ODT) disintegrating tablet 4 mg  4 mg Oral Q6H PRN Sindy Guadeloupe, NP       thiamine (VITAMIN B1) tablet 100 mg  100 mg Oral Daily Sindy Guadeloupe, NP   100 mg at 01/21/23 4098   No current outpatient medications on file.    PTA Medications:  Facility Ordered Medications  Medication   acetaminophen (TYLENOL) tablet 650 mg   alum & mag hydroxide-simeth (MAALOX/MYLANTA) 200-200-20 MG/5ML suspension 30 mL   magnesium hydroxide (MILK OF MAGNESIA) suspension 30 mL   [COMPLETED] thiamine (VITAMIN B1) injection 100 mg   thiamine (VITAMIN B1) tablet 100 mg   multivitamin with minerals tablet 1 tablet   LORazepam (ATIVAN) tablet 1 mg   hydrOXYzine (ATARAX) tablet 25 mg   loperamide (IMODIUM) capsule 2-4 mg   ondansetron (ZOFRAN-ODT) disintegrating tablet 4 mg   OLANZapine zydis (ZYPREXA) disintegrating tablet 10 mg   And   LORazepam (ATIVAN)  tablet 1 mg   And   ziprasidone (GEODON) injection 20 mg       01/21/2023   10:08 AM 01/18/2023    9:01 PM 05/27/2022    1:17 PM  Depression screen PHQ 2/9  Decreased Interest 0 0 0  Down, Depressed, Hopeless 1 1 0  PHQ - 2 Score 1 1 0  Altered sleeping   0  Tired, decreased energy   0  Change in appetite   0  Feeling bad or failure about yourself    0  Trouble concentrating   0  Moving slowly or fidgety/restless   0  Suicidal thoughts   0  PHQ-9 Score   0  Difficult doing work/chores   Not difficult at all    Flowsheet Row ED from [facility]/18/20[facility] in [facility] ED from [facility]/25/2022 in [facility] ED from [facility]/04/2022 in [facility] Department at  [facility]  C-SSRS RISK CATEGORY No Risk No Risk No Risk       Musculoskeletal  Strength & Muscle Tone: within normal limits Gait & Station: normal Patient leans: N/A  Psychiatric Specialty Exam  Presentation  General Appearance:  Appropriate for Environment; Casual  Eye Contact: Good  Speech: Clear and Coherent; Normal Rate  Speech Volume: Normal  Handedness: Right   Mood and Affect  Mood: Euthymic  Affect: Appropriate; Congruent   Thought Process  Thought Processes: Coherent; Goal Directed; Linear  Descriptions of Associations:Intact  Orientation:Full (Time, Place and Person)  Thought Content:Logical  Diagnosis of Schizophrenia or Schizoaffective disorder in past: No    Hallucinations:Hallucinations: None  Ideas of Reference:None  Suicidal Thoughts:Suicidal Thoughts: No  Homicidal Thoughts:Homicidal Thoughts: No   Sensorium  Memory: Remote Good  Judgment: Good  Insight: Good   Executive Functions  Concentration: Good  Attention Span: Good  Recall: Good  Fund of Knowledge: Good  Language: Good   Psychomotor Activity  Psychomotor Activity: Psychomotor Activity: Normal   Assets  Assets: Communication Skills; Resilience; Desire for Improvement   Sleep  Sleep: Sleep: Good   No data recorded  Physical Exam  Physical Exam ROS Blood pressure 98/68, pulse 66, temperature 98.4 F (36.9 C), temperature source Oral, resp. rate 16, SpO2 100 %. There is no height or weight on file to calculate BMI.  Demographic Factors:  Male and Low socioeconomic status  Loss Factors: NA  Historical Factors: Impulsivity  Risk Reduction Factors:   Positive social support, Positive therapeutic relationship, and Positive coping skills or problem solving skills  Continued Clinical Symptoms:  More than one psychiatric diagnosis Previous Psychiatric Diagnoses and Treatments  Cognitive Features That Contribute To Risk:   None    Suicide Risk:  Mild:  Suicidal ideation of limited frequency, intensity, duration, and specificity.  There are no identifiable plans, no associated intent, mild dysphoria and related symptoms, good self-control (both objective and subjective assessment), few other risk factors, and identifiable protective factors, including available and accessible social support.  Plan Of Care/Follow-up recommendations:   Follow-up recommendations:   Activity:  as tolerated Diet:  heart healthy   Comments:  Prescriptions were given at discharge.  Patient is agreeable with the discharge plan.  Patient was given an opportunity to ask questions.  Patient appears to feel comfortable with discharge and denies any current suicidal or homicidal thoughts.    Patient is instructed prior to discharge to: Take all medications as prescribed by mental healthcare provider. Report any adverse effects and or reactions  from the medicines to outpatient provider promptly. In the event of worsening symptoms, patient is instructed to call the crisis hotline, 911 and or go to the nearest ED for appropriate evaluation and treatment of symptoms. Patient is to follow-up with primary care provider for other medical issues, concerns and or health care needs.   Park Pope, MD 01/21/2023, 12:23 PM

## 2023-01-21 NOTE — ED Notes (Signed)
Patient was discharged to home today by provider. Patient denies SI/HI. Patient was given an AVS with community resources. Patient reported he had his own ride for transportation.

## 2023-01-21 NOTE — ED Provider Notes (Signed)
FBC Progress Note  Date and Time: 01/21/2023 10:08 AM Name: Lance Walton MRN:  [id number]  Reason For Admission: Alcohol, Cocaine, Cannabis abuse  Subjective:   Patient seen and assessed at bedside.  Patient denies any acute complaints at this time. States he is in communication with [facility] and [facility] today again to confirm he can be accepted. He is irritable today but apologized. He did not want to disclose why he was irritable. No further questions at this time.  Denies SI/HI/AVH at this time.  Diagnosis:  Final diagnoses:  Alcohol abuse  Cocaine abuse (HCC)  Marijuana abuse    Total Time spent with patient: 30 minutes   Labs  Lab Results:     Latest Ref Rng & Units 01/18/2023    9:00 PM 05/25/2022    4:59 PM 11/13/2021    8:22 PM  CBC  WBC 4.0 - 10.5 K/uL 7.5  8.2  13.5   Hemoglobin 13.0 - 17.0 g/dL 40.9  81.1  91.4   Hematocrit 39.0 - 52.0 % 47.1  47.3  47.9   Platelets 150 - 400 K/uL 335  251  267       Latest Ref Rng & Units 01/18/2023    9:00 PM 05/25/2022    4:59 PM 11/13/2021    8:22 PM  CMP  Glucose 70 - 99 mg/dL 85  96  782   BUN 6 - 20 mg/dL 10  <5  7   Creatinine 0.61 - 1.24 mg/dL 9.56  2.13  0.86   Sodium 135 - 145 mmol/L 140  138  136   Potassium 3.5 - 5.1 mmol/L 4.0  3.9  3.9   Chloride 98 - 111 mmol/L 100  100  99   CO2 22 - 32 mmol/L 29  31  25    Calcium 8.9 - 10.3 mg/dL 9.1  9.0  9.0   Total Protein 6.5 - 8.1 g/dL 5.7  5.9  5.9   Total Bilirubin 0.3 - 1.2 mg/dL 0.5  0.7  0.5   Alkaline Phos 38 - 126 U/L 64  59  52   AST 15 - 41 U/L 30  20  22    ALT 0 - 44 U/L 22  14  16      Physical Findings   PHQ2-9    Flowsheet Row ED from 01/18/2023 in [facility] ED from 05/25/2022 in [facility] ED from 11/13/2021 in [facility]  PHQ-2 Total Score 1 0 1  PHQ-9 Total Score -- 0 9      Flowsheet Row ED from 01/18/2023 in [facility] ED from  05/25/2022 in [facility] ED from 02/04/2022 in [facility] Emergency Department at [facility]  C-SSRS RISK CATEGORY No Risk No Risk No Risk        Musculoskeletal  Strength & Muscle Tone: within normal limits Gait & Station: normal Patient leans: N/A  Psychiatric Specialty Exam  Presentation  General Appearance:  Appropriate for Environment; Casual   Eye Contact: Good   Speech: Clear and Coherent; Normal Rate   Speech Volume: Normal   Handedness: Right    Mood and Affect  Mood: Euthymic   Affect: Appropriate; Congruent    Thought Process  Thought Processes: Coherent; Goal Directed; Linear   Descriptions of Associations:Intact   Orientation:Full (Time, Place and Person)   Thought Content:Logical   Diagnosis of Schizophrenia or Schizoaffective disorder in past: No  Hallucinations:Hallucinations: None    Ideas of Reference:None   Suicidal Thoughts:Suicidal Thoughts: No    Homicidal Thoughts:Homicidal Thoughts: No     Sensorium  Memory: Remote Good   Judgment: Good   Insight: Good    Executive Functions  Concentration: Good   Attention Span: Good   Recall: Good   Fund of Knowledge: Good   Language: Good    Psychomotor Activity  Psychomotor Activity: Psychomotor Activity: Normal     Assets  Assets: Communication Skills; Resilience; Desire for Improvement    Sleep  Sleep: Sleep: Good     Physical Exam  Physical Exam Vitals and nursing note reviewed.  Constitutional:      General: He is not in acute distress.    Appearance: He is well-developed.  HENT:     Head: Normocephalic and atraumatic.  Eyes:     Conjunctiva/sclera: Conjunctivae normal.  Cardiovascular:     Rate and Rhythm: Normal rate and regular rhythm.     Heart sounds: No murmur heard. Pulmonary:     Effort: Pulmonary effort is normal. No respiratory distress.     Breath sounds:  Normal breath sounds.  Abdominal:     Palpations: Abdomen is soft.     Tenderness: There is no abdominal tenderness.  Musculoskeletal:        General: No swelling.     Cervical back: Neck supple.  Skin:    General: Skin is warm and dry.     Capillary Refill: Capillary refill takes less than 2 seconds.  Neurological:     Mental Status: He is alert.  Psychiatric:        Mood and Affect: Mood normal.    Review of Systems  Respiratory:  Negative for shortness of breath.   Cardiovascular:  Negative for chest pain.  Gastrointestinal:  Negative for abdominal pain, constipation, diarrhea, heartburn, nausea and vomiting.  Neurological:  Negative for headaches.   Blood pressure 98/68, pulse 66, temperature 98.4 F (36.9 C), temperature source Oral, resp. rate 16, SpO2 100 %. There is no height or weight on file to calculate BMI.  ASSESSMENT Lance Walton is a 42 y.o. male with no Significant past psychiatric history [facility] from alcohol, cannabis, and crack cocaine.  PLAN Stimulant use disorder-cocaine Alcohol use disorder Cannabis use disorder -Continue multivitamin/thiamine -CIWA score with Ativan as needed (last score this AM was 0)  [facility]   Lance Pope, MD 01/21/2023 10:08 AM
